# Patient Record
Sex: Female | Born: 1970 | Race: White | Hispanic: No | Smoking: Never smoker
Health system: Southern US, Community
[De-identification: ages and names within clinical notes are randomized; demographics above are authoritative.]

## PROBLEM LIST (undated history)

## (undated) DIAGNOSIS — F909 Attention-deficit hyperactivity disorder, unspecified type: Secondary | ICD-10-CM

## (undated) DIAGNOSIS — F32A Depression, unspecified: Secondary | ICD-10-CM

## (undated) DIAGNOSIS — L4050 Arthropathic psoriasis, unspecified: Secondary | ICD-10-CM

## (undated) DIAGNOSIS — F329 Major depressive disorder, single episode, unspecified: Secondary | ICD-10-CM

## (undated) DIAGNOSIS — J302 Other seasonal allergic rhinitis: Secondary | ICD-10-CM

## (undated) DIAGNOSIS — E079 Disorder of thyroid, unspecified: Secondary | ICD-10-CM

## (undated) DIAGNOSIS — I10 Essential (primary) hypertension: Secondary | ICD-10-CM

## (undated) HISTORY — PX: GANGLION CYST EXCISION: SHX1691

## (undated) HISTORY — PX: OVARIAN CYST REMOVAL: SHX89

## (undated) HISTORY — PX: HERNIA REPAIR: SHX51

## (undated) HISTORY — PX: CARDIAC SURGERY: SHX584

## (undated) HISTORY — PX: APPENDECTOMY: SHX54

## (undated) HISTORY — PX: SHOULDER SURGERY: SHX246

## (undated) HISTORY — PX: DILATION AND CURETTAGE OF UTERUS: SHX78

## (undated) HISTORY — PX: TONSILLECTOMY: SUR1361

---

## 2005-11-25 ENCOUNTER — Encounter: Payer: Self-pay | Admitting: Family Medicine

## 2006-03-10 ENCOUNTER — Ambulatory Visit: Payer: Self-pay | Admitting: Family Medicine

## 2006-04-02 ENCOUNTER — Ambulatory Visit: Payer: Self-pay | Admitting: Family Medicine

## 2006-04-08 ENCOUNTER — Ambulatory Visit: Payer: Self-pay | Admitting: Family Medicine

## 2006-04-28 ENCOUNTER — Ambulatory Visit: Payer: Self-pay | Admitting: Family Medicine

## 2006-05-04 ENCOUNTER — Ambulatory Visit: Payer: Self-pay | Admitting: Family Medicine

## 2006-05-05 DIAGNOSIS — G43909 Migraine, unspecified, not intractable, without status migrainosus: Secondary | ICD-10-CM | POA: Insufficient documentation

## 2006-05-05 DIAGNOSIS — F339 Major depressive disorder, recurrent, unspecified: Secondary | ICD-10-CM | POA: Insufficient documentation

## 2006-05-26 ENCOUNTER — Encounter: Payer: Self-pay | Admitting: Family Medicine

## 2006-05-28 ENCOUNTER — Encounter: Payer: Self-pay | Admitting: Family Medicine

## 2006-05-28 DIAGNOSIS — J301 Allergic rhinitis due to pollen: Secondary | ICD-10-CM | POA: Insufficient documentation

## 2006-05-29 ENCOUNTER — Ambulatory Visit: Payer: Self-pay | Admitting: Family Medicine

## 2006-07-03 ENCOUNTER — Telehealth: Payer: Self-pay | Admitting: Family Medicine

## 2006-08-05 ENCOUNTER — Ambulatory Visit: Payer: Self-pay | Admitting: Family Medicine

## 2006-08-05 DIAGNOSIS — R059 Cough, unspecified: Secondary | ICD-10-CM | POA: Insufficient documentation

## 2006-08-05 DIAGNOSIS — R05 Cough: Secondary | ICD-10-CM

## 2006-09-29 ENCOUNTER — Encounter: Payer: Self-pay | Admitting: Family Medicine

## 2006-10-21 ENCOUNTER — Ambulatory Visit: Payer: Self-pay | Admitting: Family Medicine

## 2006-10-21 DIAGNOSIS — F909 Attention-deficit hyperactivity disorder, unspecified type: Secondary | ICD-10-CM | POA: Insufficient documentation

## 2006-10-21 DIAGNOSIS — M26629 Arthralgia of temporomandibular joint, unspecified side: Secondary | ICD-10-CM | POA: Insufficient documentation

## 2006-11-04 ENCOUNTER — Encounter: Payer: Self-pay | Admitting: Family Medicine

## 2009-05-10 ENCOUNTER — Ambulatory Visit (HOSPITAL_COMMUNITY): Admission: RE | Admit: 2009-05-10 | Discharge: 2009-05-10 | Payer: Self-pay | Admitting: Obstetrics and Gynecology

## 2009-05-10 ENCOUNTER — Encounter: Payer: Self-pay | Admitting: Obstetrics and Gynecology

## 2010-03-30 ENCOUNTER — Emergency Department (HOSPITAL_BASED_OUTPATIENT_CLINIC_OR_DEPARTMENT_OTHER): Admission: EM | Admit: 2010-03-30 | Discharge: 2010-03-30 | Payer: Self-pay | Admitting: Emergency Medicine

## 2010-10-31 LAB — CBC
HCT: 39.3 % (ref 36.0–46.0)
Hemoglobin: 13.2 g/dL (ref 12.0–15.0)
MCV: 90.5 fL (ref 78.0–100.0)
Platelets: 290 10*3/uL (ref 150–400)
WBC: 9.2 10*3/uL (ref 4.0–10.5)

## 2010-10-31 LAB — RH IMMUNE GLOBULIN WORKUP (NOT WOMEN'S HOSP)

## 2011-03-28 DIAGNOSIS — F909 Attention-deficit hyperactivity disorder, unspecified type: Secondary | ICD-10-CM | POA: Insufficient documentation

## 2011-09-24 ENCOUNTER — Encounter (HOSPITAL_COMMUNITY): Payer: Self-pay | Admitting: *Deleted

## 2011-09-24 ENCOUNTER — Emergency Department (HOSPITAL_COMMUNITY)
Admission: EM | Admit: 2011-09-24 | Discharge: 2011-09-24 | Disposition: A | Discharge status: Home / Self Care | Payer: Worker's Compensation | Attending: Emergency Medicine | Admitting: Emergency Medicine

## 2011-09-24 DIAGNOSIS — W461XXA Contact with contaminated hypodermic needle, initial encounter: Secondary | ICD-10-CM

## 2011-09-24 DIAGNOSIS — G43909 Migraine, unspecified, not intractable, without status migrainosus: Secondary | ICD-10-CM | POA: Insufficient documentation

## 2011-09-24 DIAGNOSIS — F909 Attention-deficit hyperactivity disorder, unspecified type: Secondary | ICD-10-CM | POA: Insufficient documentation

## 2011-09-24 DIAGNOSIS — J45909 Unspecified asthma, uncomplicated: Secondary | ICD-10-CM | POA: Insufficient documentation

## 2011-09-24 DIAGNOSIS — E079 Disorder of thyroid, unspecified: Secondary | ICD-10-CM | POA: Insufficient documentation

## 2011-09-24 DIAGNOSIS — W460XXA Contact with hypodermic needle, initial encounter: Secondary | ICD-10-CM | POA: Insufficient documentation

## 2011-09-24 DIAGNOSIS — Z9889 Other specified postprocedural states: Secondary | ICD-10-CM | POA: Insufficient documentation

## 2011-09-24 DIAGNOSIS — Z7721 Contact with and (suspected) exposure to potentially hazardous body fluids: Secondary | ICD-10-CM | POA: Insufficient documentation

## 2011-09-24 HISTORY — DX: Disorder of thyroid, unspecified: E07.9

## 2011-09-24 HISTORY — DX: Attention-deficit hyperactivity disorder, unspecified type: F90.9

## 2011-09-24 LAB — HEPATIC FUNCTION PANEL
Albumin: 4 g/dL (ref 3.5–5.2)
Total Protein: 7.2 g/dL (ref 6.0–8.3)

## 2011-09-24 LAB — CREATININE, SERUM
Creatinine, Ser: 0.72 mg/dL (ref 0.50–1.10)
GFR calc non Af Amer: >90 mL/min (ref >90)

## 2011-09-24 MED ORDER — LOPINAVIR-RITONAVIR 200-50 MG PO TABS: 2.0000 | ORAL_TABLET | Freq: Two times a day (BID) | ORAL | Status: AC

## 2011-09-24 MED ORDER — LOPINAVIR-RITONAVIR 200-50 MG PO TABS
2.0000 | ORAL_TABLET | Freq: Once | ORAL | Status: AC
  Administered 2011-09-24: 2 via ORAL
  Filled 2011-09-24: qty 2

## 2011-09-24 MED ORDER — LAMIVUDINE-ZIDOVUDINE 150-300 MG PO TABS
1.0000 | ORAL_TABLET | Freq: Two times a day (BID) | ORAL | Status: DC
  Administered 2011-09-24: 1 via ORAL
  Filled 2011-09-24 (×3): qty 1

## 2011-09-24 MED ORDER — LAMIVUDINE-ZIDOVUDINE 150-300 MG PO TABS: 1.0000 | ORAL_TABLET | Freq: Two times a day (BID) | ORAL | Status: AC

## 2011-09-24 NOTE — ED Notes (Signed)
Patient does not need anything at this time. 

## 2011-09-24 NOTE — ED Notes (Addendum)
Pt states that she was stuck by a needle in her right thigh at [facility] at approximately 1130. Pt states that one of her clients brought in their meds in a trash bag and a needle was in it.  Pt is [redacted] weeks pregnant.

## 2011-09-28 NOTE — ED Provider Notes (Signed)
History     CSN: [id number]  Arrival date & time 09/24/11  1156   First MD Initiated Contact with Patient 09/24/11 1248      Chief Complaint  Patient presents with  . 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  She was attempting to document a new patients medicines, which were in a garbage bag.  She sustained a puncture by an uncapped lancet to her right anterior thigh when she placed the bag on her lap.  She does not know the patients full medical history except for diabetes and schizophrenia. She washed the wound with alcohol swabs after the incident and was sent here by her employer for prophylaxis and baseline drug screening.  She is immunized for Hepatitis B.  She is currently [redacted] weeks pregnant.  The history is provided by the patient.    Past Medical History  Diagnosis Date  . Asthma   . ADHD (attention deficit hyperactivity disorder)   . Thyroid disease   . Migraine     Past Surgical History  Procedure Date  . Shoulder surgery     right   . Appendectomy   . Tonsillectomy   . Hernia repair   . Ganglion cyst excision     right wrist  . Ovarian cyst removal   . Dilation and curettage of uterus     History reviewed. No pertinent family history.  History  Substance Use Topics  . Smoking status: Never Smoker   . Smokeless tobacco: Not on file  . Alcohol Use: No    OB History    Grav Para Term Preterm Abortions TAB SAB Ect Mult Living   1               Review of Systems  Constitutional: Negative for fever.  HENT: Negative for congestion, sore throat and neck pain.   Eyes: Negative.   Respiratory: Negative for chest tightness and shortness of breath.   Cardiovascular: Negative for chest pain.  Gastrointestinal: Negative for nausea and abdominal pain.  Genitourinary: Negative.   Musculoskeletal: Negative for joint  swelling and arthralgias.  Skin: Negative.  Negative for rash and wound.  Neurological: Negative for dizziness, weakness, light-headedness, numbness and headaches.  Hematological: Negative.   Psychiatric/Behavioral: Negative.     Allergies  Bactrim; Biaxin; Hydromorphone hcl; Nalbuphine; and Penicillins  Home Medications   Current Outpatient Rx  Name Route Sig Dispense Refill  . MELATONIN 10 MG PO TABS Oral Take 1 tablet by mouth at bedtime.    Marland Kitchen PRENATAL MULTI +DHA 27-0.8-228 MG PO CAPS Oral Take 1 capsule by mouth daily.    Marland Kitchen LAMIVUDINE-ZIDOVUDINE 150-300 MG PO TABS Oral Take 1 tablet by mouth 2 (two) times daily. 56 tablet 0  . LOPINAVIR-RITONAVIR 200-50 MG PO TABS Oral Take 2 tablets by mouth 2 (two) times daily. 112 tablet 0    BP 107/81  Pulse 84  Temp(Src) 98.9 F (37.2 C) (Oral)  Resp 18  Ht 5\' 6"  (1.676 m)  Wt 171 lb (77.565 kg)  BMI 27.60 kg/m2  SpO2 100%  Physical Exam  Nursing note and vitals reviewed. Constitutional: She is oriented to person, place, and time. She appears well-developed and well-nourished.  HENT:  Head: Normocephalic and atraumatic.  Eyes: Conjunctivae are normal.  Neck: Normal range of motion.  Cardiovascular: Normal rate.   Pulmonary/Chest: Effort normal.  Abdominal: Soft.  Musculoskeletal: Normal range of motion.  Neurological:  She is alert and oriented to person, place, and time.  Skin: Skin is warm and dry.       Small pen circle on patients right mid anterior thigh.  Barely perceptible skin puncture site.  Psychiatric: She has a normal mood and affect.    ED Course  Procedures (including critical care time)   Labs Reviewed  HIV RAPID SCREEN (BLD OR BODY FLD EXPOSURE)  HEPATITIS B SURFACE ANTIGEN  HEPATITIS C ANTIBODY (REFLEX)  CREATININE, SERUM  HEPATIC FUNCTION PANEL  LAB REPORT - SCANNED   No results found.   1. Exposure to body fluids by contaminated hypodermic needle stick       MDM  Discussed risks and  benefits of prophylactic tx for potential for HIV exposure.  Patient agreeable to start with combivir and kaletra 28 day tx.  Also discussed need to have employer contact source pt for blood testing as well.  Recommended f/u with pcp or employers preferred occupational med doctor for further tx/ 3 month and 6 month exposure panels.  Candis Musa, PA 09/28/11 2117

## 2011-09-29 NOTE — ED Provider Notes (Signed)
Medical screening examination/treatment/procedure(s) were performed by non-physician practitioner and as supervising physician I was immediately available for consultation/collaboration.  Nicoletta Dress. Colon Branch, MD 09/29/11 [phone]

## 2014-05-29 ENCOUNTER — Encounter (HOSPITAL_COMMUNITY): Payer: Self-pay | Admitting: *Deleted

## 2014-07-24 ENCOUNTER — Emergency Department
Admission: EM | Admit: 2014-07-24 | Discharge: 2014-07-24 | Disposition: A | Discharge status: Home / Self Care | Payer: BC Managed Care – PPO | Source: Home / Self Care | Attending: Emergency Medicine | Admitting: Emergency Medicine

## 2014-07-24 ENCOUNTER — Encounter: Payer: Self-pay | Admitting: *Deleted

## 2014-07-24 DIAGNOSIS — G43801 Other migraine, not intractable, with status migrainosus: Secondary | ICD-10-CM

## 2014-07-24 DIAGNOSIS — J0100 Acute maxillary sinusitis, unspecified: Secondary | ICD-10-CM

## 2014-07-24 DIAGNOSIS — J4521 Mild intermittent asthma with (acute) exacerbation: Secondary | ICD-10-CM

## 2014-07-24 HISTORY — DX: Other seasonal allergic rhinitis: J30.2

## 2014-07-24 MED ORDER — KETOROLAC TROMETHAMINE 60 MG/2ML IM SOLN
60.0000 mg | Freq: Once | INTRAMUSCULAR | Status: AC
  Administered 2014-07-24: 60 mg via INTRAMUSCULAR

## 2014-07-24 MED ORDER — PREDNISONE 20 MG PO TABS: 20.0000 mg | ORAL_TABLET | Freq: Two times a day (BID) | ORAL | Status: DC

## 2014-07-24 MED ORDER — AZITHROMYCIN 250 MG PO TABS: ORAL_TABLET | ORAL | Status: DC

## 2014-07-24 MED ORDER — METHYLPREDNISOLONE ACETATE 80 MG/ML IJ SUSP
80.0000 mg | Freq: Once | INTRAMUSCULAR | Status: AC
  Administered 2014-07-24: 80 mg via INTRAMUSCULAR

## 2014-07-24 MED ORDER — ONDANSETRON HCL 4 MG PO TABS: 4.0000 mg | ORAL_TABLET | Freq: Three times a day (TID) | ORAL | Status: DC | PRN

## 2014-07-24 NOTE — ED Notes (Signed)
Pt c/o bilateral ear pain, popping and fluid feeling x 4 days, with HA,sinus pressure and some vertigo. Denies fever.

## 2014-07-24 NOTE — ED Provider Notes (Signed)
CSN: [id number]     Arrival date & time 07/24/14  1758 History   First MD Initiated Contact with Patient 07/24/14 1836     Chief Complaint  Patient presents with  . Otalgia  . Headache   migraine, upper respiratory symptoms  Complex history: Patient is a 43 y.o. female presenting with migraines. The history is provided by the patient.  Migraine This is a recurrent problem. The current episode started 2 days ago. The problem occurs constantly. The problem has been gradually worsening. Associated symptoms include headaches. Pertinent negatives include no chest pain, no abdominal pain and no shortness of breath. Associated symptoms comments: Mild vertigo without any other focal neurologic symptoms. Had mild nausea which resolved. Exacerbated by: Exposure to light. The symptoms are relieved by rest. She has tried nothing for the symptoms.   used Imitrex in the past but has since found to be allergic or intolerant to it. Her migraine headache is bi-temporal bi-frontal and parietal bilaterally. Dull. Intensity 5 out of 10. This is not the worst migraine she is ever had. She states that Toradol has worked well in the past. She will be seeing her neurologist within the next week for reevaluation.  URI HISTORY  Helmut Musterlicia is a 43 y.o. female who complains of onset of upper respiratory/sinus symptoms for 4 days. Progressively worsening. And she feels this triggered a migraine the past 2 days which is progressively worsening. She also feels it's just started to trigger her asthma which had been controlled.  Have been using over-the-counter treatment which did not significantly help.  Mild chills/sweats +  Fever  +  Nasal congestion +  Discolored, green nasal drainage Positive sinus pain/pressure No sore throat  +  Cough, nonproductive Mild wheezing Positive chest congestion No hemoptysis No shortness of breath No pleuritic pain  No itchy/red eyes Mild bilateral earache  Had mild nausea,  which has since resolved No vomiting No abdominal pain No diarrhea  No skin rashes +  Fatigue No myalgias No headache   Past Medical History  Diagnosis Date  . Asthma   . ADHD (attention deficit hyperactivity disorder)   . Thyroid disease   . Migraine   . Seasonal allergies    Past Surgical History  Procedure Laterality Date  . Shoulder surgery      right   . Appendectomy    . Tonsillectomy    . Hernia repair    . Ganglion cyst excision      right wrist  . Ovarian cyst removal    . Dilation and curettage of uterus     Family History  Problem Relation Age of Onset  . Migraines Mother   . GER disease Mother   . Narcolepsy Mother   . Restless legs syndrome Mother    History  Substance Use Topics  . Smoking status: Never Smoker   . Smokeless tobacco: Not on file  . Alcohol Use: No   OB History    Gravida Para Term Preterm AB TAB SAB Ectopic Multiple Living   1              Review of Systems  Respiratory: Negative for shortness of breath.   Cardiovascular: Negative for chest pain.  Gastrointestinal: Negative for abdominal pain.  Neurological: Positive for headaches.  All other systems reviewed and are negative.  Note: She has a 164-year-old. She is breast-feeding, only sometimes at night. We reviewed that she should pump her breasts and not breast-feed while taking any treatment today  or taking any prescribed medications. She voiced understanding  Allergies  Bactrim; Biaxin; Clarithromycin; Dilaudid; Hydromorphone hcl; Imitrex; Nalbuphine; and Penicillins Has taken Zithromax in the past without problems Home Medications   Prior to Admission medications   Medication Sig Start Date End Date Taking? Authorizing Provider  loratadine (CLARITIN) 10 MG tablet Take 10 mg by mouth daily.   Yes Historical Provider, MD  montelukast (SINGULAIR) 10 MG tablet Take 10 mg by mouth at bedtime.   Yes Historical Provider, MD  norethindrone-ethinyl estradiol (JUNEL FE,GILDESS  FE,LOESTRIN FE) 1-20 MG-MCG tablet Take 1 tablet by mouth daily.   Yes Historical Provider, MD  azithromycin (ZITHROMAX Z-PAK) 250 MG tablet Take 2 tablets on day one, then 1 tablet daily on days 2 through 5 07/24/14   Lajean Manes, MD  Melatonin 10 MG TABS Take 1 tablet by mouth at bedtime.    Historical Provider, MD  predniSONE (DELTASONE) 20 MG tablet Take 1 tablet (20 mg total) by mouth 2 (two) times daily with a meal. X 7 days 07/24/14   Lajean Manes, MD  Prenatal Vit-Fe Fum-FA-Omega (PRENATAL MULTI +DHA) 27-0.8-228 MG CAPS Take 1 capsule by mouth daily.    Historical Provider, MD   BP 122/71 mmHg  Pulse 82  Temp(Src) 98.3 F (36.8 C) (Oral)  Resp 16  Ht 5\' 6"  (1.676 m)  Wt 177 lb (80.287 kg)  BMI 28.58 kg/m2  SpO2 100%  Breastfeeding? Unknown Physical Exam  Constitutional: She is oriented to person, place, and time. She appears well-developed and well-nourished. She appears distressed (Uncomfortable from headache.).  HENT:  Head: Normocephalic and atraumatic. Head is without raccoon's eyes, without contusion, without right periorbital erythema and without left periorbital erythema.  Right Ear: Tympanic membrane, external ear and ear canal normal. No drainage. Tympanic membrane is not erythematous.  Left Ear: Tympanic membrane, external ear and ear canal normal. No drainage. Tympanic membrane is not erythematous.  Nose: Mucosal edema and rhinorrhea present. Right sinus exhibits maxillary sinus tenderness and frontal sinus tenderness. Left sinus exhibits maxillary sinus tenderness and frontal sinus tenderness.  Mouth/Throat: Oropharynx is clear and moist and mucous membranes are normal. No oral lesions. No dental abscesses. No oropharyngeal exudate, posterior oropharyngeal edema or posterior oropharyngeal erythema.  Nose: Boggy turbinates with serous mucoid drainage bilaterally.   No cranial bruits.  Normal temporal arteries.  Eyes: Conjunctivae, EOM and lids are normal. Pupils are  equal, round, and reactive to light. Right eye exhibits no discharge. Left eye exhibits no discharge. No scleral icterus.  Fundoscopic exam:      The right eye shows no exudate, no hemorrhage and no papilledema.       The left eye shows no exudate, no hemorrhage and no papilledema.  Neck: Neck supple. Normal carotid pulses and no JVD present. Carotid bruit is not present. No tracheal deviation present. No thyromegaly present.  Cardiovascular: Normal rate, regular rhythm and normal heart sounds.   Pulmonary/Chest: Effort normal and breath sounds normal. No respiratory distress. She has no decreased breath sounds. She has no wheezes. She has no rhonchi. She has no rales.  Breath sounds normal, except minimal late expiratory wheeze on forced expiration only.  Abdominal: Soft. There is no tenderness.  Musculoskeletal: She exhibits no edema or tenderness.  Lymphadenopathy:    She has no cervical adenopathy.  Neurological: She is alert and oriented to person, place, and time. She has normal strength and normal reflexes. She displays no tremor. No cranial nerve deficit or sensory deficit. Coordination and  gait normal.  Reflex Scores:      Tricep reflexes are 2+ on the right side and 2+ on the left side.      Bicep reflexes are 2+ on the right side and 2+ on the left side.      Brachioradialis reflexes are 2+ on the right side and 2+ on the left side.      Patellar reflexes are 2+ on the right side and 2+ on the left side.      Achilles reflexes are 2+ on the right side and 2+ on the left side. Mildly positive Romberg. No focal neurologic deficit  Skin: Skin is warm and dry. No rash noted.  Psychiatric: She has a normal mood and affect.  Nursing note and vitals reviewed.   ED Course  Procedures (including critical care time)   MDM   1. Other migraine with status migrainosus, not intractable   2. Acute maxillary sinusitis, recurrence not specified   3. Asthma with acute exacerbation, mild  intermittent    No evidence of any acute neurologic or intracranial event. Over 45 minutes spent, greater than 50% of the time spent for counseling and coordination of care. Treatment options discussed, as well as risks, benefits, alternatives. Patient voiced understanding and agreement with the following plans: Depo-Medrol 80 mg IM stat and Toradol 60 mg IM stat. She was observed for over 20 minutes and did well. Her headache significantly improved. Discharge Medication List as of 07/24/2014  6:55 PM    START taking these medications   Details  azithromycin (ZITHROMAX Z-PAK) 250 MG tablet Take 2 tablets on day one, then 1 tablet daily on days 2 through 5, Normal    predniSONE (DELTASONE) 20 MG tablet Take 1 tablet (20 mg total) by mouth 2 (two) times daily with a meal. X 7 days, Starting 07/24/2014, Until Discontinued, Normal       Zofran 4 mg by mouth every 8 hours when necessary nausea. Other symptomatic care discussed. Other advice given. Follow-up with your  neurologist and primary care doctor in 5-7 days , or sooner if symptoms become worse. Precautions discussed. Red flags discussed.--Emergency room if any red flag  Questions invited and answered. Patient voiced understanding and agreement.    Lajean Manesavid Massey, MD 07/24/14 [phone]

## 2014-10-21 ENCOUNTER — Emergency Department
Admission: EM | Admit: 2014-10-21 | Discharge: 2014-10-21 | Disposition: A | Discharge status: Home / Self Care | Payer: BLUE CROSS/BLUE SHIELD | Source: Home / Self Care | Attending: Family Medicine | Admitting: Family Medicine

## 2014-10-21 ENCOUNTER — Encounter: Payer: Self-pay | Admitting: *Deleted

## 2014-10-21 DIAGNOSIS — J069 Acute upper respiratory infection, unspecified: Secondary | ICD-10-CM | POA: Diagnosis not present

## 2014-10-21 DIAGNOSIS — B9789 Other viral agents as the cause of diseases classified elsewhere: Principal | ICD-10-CM

## 2014-10-21 DIAGNOSIS — J9801 Acute bronchospasm: Secondary | ICD-10-CM

## 2014-10-21 MED ORDER — ALBUTEROL SULFATE HFA 108 (90 BASE) MCG/ACT IN AERS: 2.0000 | INHALATION_SPRAY | RESPIRATORY_TRACT | Status: AC | PRN

## 2014-10-21 MED ORDER — AZITHROMYCIN 250 MG PO TABS: ORAL_TABLET | ORAL | Status: DC

## 2014-10-21 MED ORDER — PREDNISONE 20 MG PO TABS: 20.0000 mg | ORAL_TABLET | Freq: Two times a day (BID) | ORAL | Status: DC

## 2014-10-21 NOTE — ED Provider Notes (Signed)
CSN: [id number]     Arrival date & time 10/21/14  1509 History   First MD Initiated Contact with Patient 10/21/14 1628     Chief Complaint  Patient presents with  . Cough      HPI Comments: Patient complains of two day history of typical cold-like symptoms including mild sore throat, sinus congestion, headache, chills/sweats, and cough.  She has developed some loose stools but no nausea/vomiting.  She has developed shortness of breath with activity but no wheezing.  She has a history of mild asthma.  The history is provided by the patient.    Past Medical History  Diagnosis Date  . Asthma   . ADHD (attention deficit hyperactivity disorder)   . Thyroid disease   . Migraine   . Seasonal allergies    Past Surgical History  Procedure Laterality Date  . Shoulder surgery      right   . Appendectomy    . Tonsillectomy    . Hernia repair    . Ganglion cyst excision      right wrist  . Ovarian cyst removal    . Dilation and curettage of uterus     Family History  Problem Relation Age of Onset  . Migraines Mother   . GER disease Mother   . Narcolepsy Mother   . Restless legs syndrome Mother    History  Substance Use Topics  . Smoking status: Never Smoker   . Smokeless tobacco: Not on file  . Alcohol Use: No   OB History    Gravida Para Term Preterm AB TAB SAB Ectopic Multiple Living   1              Review of Systems + sore throat + sneezing + cough No pleuritic pain No wheezing + nasal congestion + post-nasal drainage No sinus pain/pressure No itchy/red eyes ? earache No hemoptysis + SOB with activity No fever, + chills No nausea No vomiting No abdominal pain + diarrhea No urinary symptoms No skin rash + fatigue No myalgias + headache Used OTC meds without relief  Allergies  Bactrim; Biaxin; Clarithromycin; Dilaudid; Hydromorphone hcl; Imitrex; Nalbuphine; and Penicillins  Home Medications   Prior to Admission medications   Medication Sig Start  Date End Date Taking? Authorizing Provider  albuterol (PROVENTIL HFA;VENTOLIN HFA) 108 (90 BASE) MCG/ACT inhaler Inhale 2 puffs into the lungs every 4 (four) hours as needed for wheezing or shortness of breath. 10/21/14   Lattie Haw, MD  azithromycin (ZITHROMAX Z-PAK) 250 MG tablet Take 2 tablets on day one, then 1 tablet daily on days 2 through 5 (Rx void after 10/29/14) 10/21/14   Lattie Haw, MD  loratadine (CLARITIN) 10 MG tablet Take 10 mg by mouth daily.    Historical Provider, MD  Melatonin 10 MG TABS Take 1 tablet by mouth at bedtime.    Historical Provider, MD  montelukast (SINGULAIR) 10 MG tablet Take 10 mg by mouth at bedtime.    Historical Provider, MD  norethindrone-ethinyl estradiol (JUNEL FE,GILDESS FE,LOESTRIN FE) 1-20 MG-MCG tablet Take 1 tablet by mouth daily.    Historical Provider, MD  ondansetron (ZOFRAN) 4 MG tablet Take 1 tablet (4 mg total) by mouth every 8 (eight) hours as needed for nausea or vomiting. 07/24/14   Lajean Manes, MD  predniSONE (DELTASONE) 20 MG tablet Take 1 tablet (20 mg total) by mouth 2 (two) times daily with a meal. X 5 days 10/21/14   Lattie Haw, MD  Prenatal  Vit-Fe Fum-FA-Omega (PRENATAL MULTI +DHA) 27-0.8-228 MG CAPS Take 1 capsule by mouth daily.    Historical Provider, MD   BP 133/85 mmHg  Pulse 89  Temp(Src) 98.1 F (36.7 C) (Oral)  Ht 5\' 6"  (1.676 m)  Wt 179 lb (81.194 kg)  BMI 28.91 kg/m2  SpO2 99% Physical Exam Nursing notes and Vital Signs reviewed. Appearance:  Patient appears stated age, and in no acute distress Eyes:  Pupils are equal, round, and reactive to light and accomodation.  Extraocular movement is intact.  Conjunctivae are not inflamed  Ears:  Canals normal.  Tympanic membranes normal.  Nose:  Mildly congested turbinates.  No sinus tenderness.  Pharynx:  Normal Neck:  Supple.  Slightly tender shotty posterior nodes are palpated bilaterally  Lungs:   Faint expiratory wheezes heard anteriorly.  Breath sounds are  equal.  Heart:  Regular rate and rhythm without murmurs, rubs, or gallops.  Abdomen:  Nontender without masses or hepatosplenomegaly.  Bowel sounds are present.  No CVA or flank tenderness.  Extremities:  No edema.  No calf tenderness Skin:  No rash present.   ED Course  Procedures  none   MDM   1. Viral URI with cough   2. Bronchospasm    There is no evidence of bacterial infection today.  Treat symptomatically for now  Prednisone burst.  Prescription written for Benzonatate (Tessalon) to take at bedtime for night-time cough.  Refill albuterol MDI Take plain guaifenesin (1200mg  extended release tabs such as Mucinex) twice daily, with plenty of water, for cough and congestion. Get adequate rest.   May use Afrin nasal spray (or generic oxymetazoline) twice daily for about 5 days.  Also recommend using saline nasal spray several times daily and saline nasal irrigation (AYR is a common brand).   Try warm salt water gargles for sore throat.  Stop all antihistamines for now, and other non-prescription cough/cold preparations. May continue albuterol by nebulizer as needed. Begin Azithromycin if not improving about one week or if persistent fever develops (Given a prescription to hold, with an expiration date.  Although she claims adverse effects from Biaxin, she has taken azithromycin without adverse effects)  Follow-up with family doctor if not improving about10 days.     Lattie HawStephen A Hakim Minniefield, MD 10/28/14 1005

## 2014-10-21 NOTE — Discharge Instructions (Signed)
Take plain guaifenesin (1200mg  extended release tabs such as Mucinex) twice daily, with plenty of water, for cough and congestion. Get adequate rest.   May use Afrin nasal spray (or generic oxymetazoline) twice daily for about 5 days.  Also recommend using saline nasal spray several times daily and saline nasal irrigation (AYR is a common brand).   Try warm salt water gargles for sore throat.  Stop all antihistamines for now, and other non-prescription cough/cold preparations. May continue albuterol by nebulizer as needed. Begin Azithromycin if not improving about one week or if persistent fever develops  Follow-up with family doctor if not improving about10 days.

## 2014-10-21 NOTE — ED Notes (Signed)
Pt complains of cough and congestion for 2 days.  Cough is productive and green. Also some diarrhea.

## 2015-02-18 ENCOUNTER — Encounter: Payer: Self-pay | Admitting: Emergency Medicine

## 2015-02-18 ENCOUNTER — Emergency Department
Admission: EM | Admit: 2015-02-18 | Discharge: 2015-02-18 | Disposition: A | Discharge status: Home / Self Care | Payer: BLUE CROSS/BLUE SHIELD | Source: Home / Self Care | Attending: Family Medicine | Admitting: Family Medicine

## 2015-02-18 ENCOUNTER — Emergency Department (INDEPENDENT_AMBULATORY_CARE_PROVIDER_SITE_OTHER): Payer: BLUE CROSS/BLUE SHIELD

## 2015-02-18 DIAGNOSIS — M79671 Pain in right foot: Secondary | ICD-10-CM

## 2015-02-18 DIAGNOSIS — S93401A Sprain of unspecified ligament of right ankle, initial encounter: Secondary | ICD-10-CM | POA: Diagnosis not present

## 2015-02-18 DIAGNOSIS — S93601A Unspecified sprain of right foot, initial encounter: Secondary | ICD-10-CM | POA: Diagnosis not present

## 2015-02-18 DIAGNOSIS — M25571 Pain in right ankle and joints of right foot: Secondary | ICD-10-CM | POA: Diagnosis not present

## 2015-02-18 HISTORY — DX: Arthropathic psoriasis, unspecified: L40.50

## 2015-02-18 NOTE — ED Provider Notes (Signed)
CSN: [id number]     Arrival date & time 02/18/15  1140 History   First MD Initiated Contact with Patient 02/18/15 1218     Chief Complaint  Patient presents with  . Ankle Pain  . Foot Pain      HPI Comments: Yesterday while playing with her dog, patient inverted her right foot/ankle and felt two popping sensations followed by pain/swelling on her dorsal/lateral foot.  Patient is a 44 y.o. female presenting with ankle pain. The history is provided by the patient.  Ankle Pain Location:  Ankle Time since incident:  1 day Injury: yes   Mechanism of injury comment:  Inverted ankle Ankle location:  R ankle Pain details:    Quality:  Aching   Radiates to:  Does not radiate   Severity:  Mild   Onset quality:  Sudden   Duration:  1 day   Timing:  Constant   Progression:  Unchanged Chronicity:  New Dislocation: no   Prior injury to area:  No Relieved by:  Nothing Worsened by:  Bearing weight, extension and flexion Ineffective treatments:  NSAIDs and ice Associated symptoms: stiffness and swelling   Associated symptoms: no back pain, no decreased ROM, no muscle weakness, no numbness and no tingling     Past Medical History  Diagnosis Date  . Asthma   . ADHD (attention deficit hyperactivity disorder)   . Thyroid disease   . Migraine   . Seasonal allergies   . Psoriatic arthritis    Past Surgical History  Procedure Laterality Date  . Shoulder surgery      right   . Appendectomy    . Tonsillectomy    . Hernia repair    . Ganglion cyst excision      right wrist  . Ovarian cyst removal    . Dilation and curettage of uterus     Family History  Problem Relation Age of Onset  . Migraines Mother   . GER disease Mother   . Narcolepsy Mother   . Restless legs syndrome Mother    History  Substance Use Topics  . Smoking status: Never Smoker   . Smokeless tobacco: Not on file  . Alcohol Use: No   OB History    Gravida Para Term Preterm AB TAB SAB Ectopic Multiple Living     1              Review of Systems  Musculoskeletal: Positive for stiffness. Negative for back pain.  All other systems reviewed and are negative.   Allergies  Bactrim; Biaxin; Clarithromycin; Dilaudid; Hydromorphone hcl; Imitrex; Nalbuphine; and Penicillins  Home Medications   Prior to Admission medications   Medication Sig Start Date End Date Taking? Authorizing Provider  DULoxetine (CYMBALTA) 20 MG capsule Take 20 mg by mouth daily.   Yes Historical Provider, MD  methocarbamol (ROBAXIN) 750 MG tablet Take 750 mg by mouth 3 (three) times daily.   Yes Historical Provider, MD  albuterol (PROVENTIL HFA;VENTOLIN HFA) 108 (90 BASE) MCG/ACT inhaler Inhale 2 puffs into the lungs every 4 (four) hours as needed for wheezing or shortness of breath. 10/21/14   Lattie Haw, MD  azithromycin (ZITHROMAX Z-PAK) 250 MG tablet Take 2 tablets on day one, then 1 tablet daily on days 2 through 5 (Rx void after 10/29/14) 10/21/14   Lattie Haw, MD  loratadine (CLARITIN) 10 MG tablet Take 10 mg by mouth daily.    Historical Provider, MD  Melatonin 10 MG TABS Take 1  tablet by mouth at bedtime.    Historical Provider, MD  montelukast (SINGULAIR) 10 MG tablet Take 10 mg by mouth at bedtime.    Historical Provider, MD  norethindrone-ethinyl estradiol (JUNEL FE,GILDESS FE,LOESTRIN FE) 1-20 MG-MCG tablet Take 1 tablet by mouth daily.    Historical Provider, MD  ondansetron (ZOFRAN) 4 MG tablet Take 1 tablet (4 mg total) by mouth every 8 (eight) hours as needed for nausea or vomiting. 07/24/14   Lajean Manes, MD  predniSONE (DELTASONE) 20 MG tablet Take 1 tablet (20 mg total) by mouth 2 (two) times daily with a meal. X 5 days 10/21/14   Lattie Haw, MD  Prenatal Vit-Fe Fum-FA-Omega (PRENATAL MULTI +DHA) 27-0.8-228 MG CAPS Take 1 capsule by mouth daily.    Historical Provider, MD   BP 120/83 mmHg  Pulse 96  Temp(Src) 98.4 F (36.9 C) (Oral)  Resp 1  Ht 5\' 5"  (1.651 m)  Wt 174 lb (78.926 kg)  BMI 28.96  kg/m2  SpO2 97%  LMP 01/22/2015 (Exact Date) Physical Exam  Constitutional: She is oriented to person, place, and time. She appears well-developed and well-nourished. No distress.  HENT:  Head: Atraumatic.  Eyes: Pupils are equal, round, and reactive to light.  Musculoskeletal:       Right ankle: She exhibits swelling. She exhibits normal range of motion, no ecchymosis, no deformity, no laceration and normal pulse. Tenderness. Lateral malleolus, AITFL and CF ligament tenderness found. No posterior TFL, no head of 5th metatarsal and no proximal fibula tenderness found. Achilles tendon normal.       Feet:  Neurological: She is alert and oriented to person, place, and time.  Skin: Skin is warm and dry.  Nursing note and vitals reviewed.   ED Course  Procedures  none  Imaging Review Dg Ankle Complete Right  02/18/2015   CLINICAL DATA:  Twisting injury right ankle today. The patient heard 2 pops.  EXAM: RIGHT ANKLE - COMPLETE 3+ VIEW  COMPARISON:  None.  FINDINGS: There is no evidence of fracture, dislocation, or joint effusion. There is no evidence of arthropathy or other focal bone abnormality. Soft tissues are unremarkable.  IMPRESSION: Negative exam.   Electronically Signed   By: Drusilla Kanner M.D.   On: 02/18/2015 12:28   Dg Foot Complete Right  02/18/2015   CLINICAL DATA:  Rolled right ankle at home while bending over to pick up a ball. Lateral ankle and proximal lateral dorsal foot pain as well as some pain at the medial aspect of the heel. Initial encounter.  EXAM: RIGHT FOOT COMPLETE - 3+ VIEW  COMPARISON:  None.  FINDINGS: There is no evidence of fracture or dislocation. There is no evidence of arthropathy or other focal bone abnormality. Soft tissues are unremarkable.  IMPRESSION: Negative.   Electronically Signed   By: Sebastian Ache   On: 02/18/2015 12:30     MDM   1. Right ankle sprain, initial encounter   2. Right foot sprain, initial encounter    Ace wrap applied.   AirCast stirrup splint fitted. Apply ice pack for 30 minutes every 1 to 2 hours today and tomorrow.  Elevate.  Use crutches for 3 to 5 days (patient has crutches at home).  Wear Ace wrap until swelling decreases.  Wear brace for about 2 to 3 weeks.  Begin range of motion and stretching exercises in about 5 days as per instruction sheet.  May take Ibuprofen 200mg , 4 tabs every 8 hours with food.  Followup  with Dr. Rodney Langton or Dr. Denyse Amass ([facility]) if not improving about two weeks.     Lattie Haw, MD 02/18/15 1246

## 2015-02-18 NOTE — ED Notes (Signed)
Reports standing up and rolling right foot/ankle yesterday; iced and elevated but more pain today plus edema.

## 2015-02-18 NOTE — Discharge Instructions (Signed)
Apply ice pack for 30 minutes every 1 to 2 hours today and tomorrow.  Elevate.  Use crutches for 3 to 5 days.  Wear Ace wrap until swelling decreases.  Wear brace for about 2 to 3 weeks.  Begin range of motion and stretching exercises in about 5 days as per instruction sheet.  May take Ibuprofen 200mg , 4 tabs every 8 hours with food.    Ankle Sprain An ankle sprain is an injury to the strong, fibrous tissues (ligaments) that hold the bones of your ankle joint together.  CAUSES An ankle sprain is usually caused by a fall or by twisting your ankle. Ankle sprains most commonly occur when you step on the outer edge of your foot, and your ankle turns inward. People who participate in sports are more prone to these types of injuries.  SYMPTOMS   Pain in your ankle. The pain may be present at rest or only when you are trying to stand or walk.  Swelling.  Bruising. Bruising may develop immediately or within 1 to 2 days after your injury.  Difficulty standing or walking, particularly when turning corners or changing directions. DIAGNOSIS  Your caregiver will ask you details about your injury and perform a physical exam of your ankle to determine if you have an ankle sprain. During the physical exam, your caregiver will press on and apply pressure to specific areas of your foot and ankle. Your caregiver will try to move your ankle in certain ways. An X-ray exam may be done to be sure a bone was not broken or a ligament did not separate from one of the bones in your ankle (avulsion fracture).  TREATMENT  Certain types of braces can help stabilize your ankle. Your caregiver can make a recommendation for this. Your caregiver may recommend the use of medicine for pain. If your sprain is severe, your caregiver may refer you to a surgeon who helps to restore function to parts of your skeletal system (orthopedist) or a physical therapist. HOME CARE INSTRUCTIONS   Apply ice to your injury for 1-2 days or as  directed by your caregiver. Applying ice helps to reduce inflammation and pain.  Put ice in a plastic bag.  Place a towel between your skin and the bag.  Leave the ice on for 15-20 minutes at a time, every 2 hours while you are awake.  Only take over-the-counter or prescription medicines for pain, discomfort, or fever as directed by your caregiver.  Elevate your injured ankle above the level of your heart as much as possible for 2-3 days.  If your caregiver recommends crutches, use them as instructed. Gradually put weight on the affected ankle. Continue to use crutches or a cane until you can walk without feeling pain in your ankle.  If you have a plaster splint, wear the splint as directed by your caregiver. Do not rest it on anything harder than a pillow for the first 24 hours. Do not put weight on it. Do not get it wet. You may take it off to take a shower or bath.  You may have been given an elastic bandage to wear around your ankle to provide support. If the elastic bandage is too tight (you have numbness or tingling in your foot or your foot becomes cold and blue), adjust the bandage to make it comfortable.  If you have an air splint, you may blow more air into it or let air out to make it more comfortable. You may take  your splint off at night and before taking a shower or bath. Wiggle your toes in the splint several times per day to decrease swelling. SEEK MEDICAL CARE IF:   You have rapidly increasing bruising or swelling.  Your toes feel extremely cold or you lose feeling in your foot.  Your pain is not relieved with medicine. SEEK IMMEDIATE MEDICAL CARE IF:  Your toes are numb or blue.  You have severe pain that is increasing. MAKE SURE YOU:   Understand these instructions.  Will watch your condition.  Will get help right away if you are not doing well or get worse. Document Released: 07/14/2005 Document Revised: 04/07/2012 Document Reviewed: 07/26/2011 [facility]  Patient Information 2015 [place], [place]. This information is not intended to replace advice given to you by your health care provider. Make sure you discuss any questions you have with your health care provider.   Foot Sprain The muscles and cord like structures which attach muscle to bone (tendons) that surround the feet are made up of units. A foot sprain can occur at the weakest spot in any of these units. This condition is most often caused by injury to or overuse of the foot, as from playing contact sports, or aggravating a previous injury, or from poor conditioning, or obesity. SYMPTOMS  Pain with movement of the foot.  Tenderness and swelling at the injury site.  Loss of strength is present in moderate or severe sprains. THE THREE GRADES OR SEVERITY OF FOOT SPRAIN ARE:  Mild (Grade I): Slightly pulled muscle without tearing of muscle or tendon fibers or loss of strength.  Moderate (Grade II): Tearing of fibers in a muscle, tendon, or at the attachment to bone, with small decrease in strength.  Severe (Grade III): Rupture of the muscle-tendon-bone attachment, with separation of fibers. Severe sprain requires surgical repair. Often repeating (chronic) sprains are caused by overuse. Sudden (acute) sprains are caused by direct injury or over-use. DIAGNOSIS  Diagnosis of this condition is usually by your own observation. If problems continue, a caregiver may be required for further evaluation and treatment. X-rays may be required to make sure there are not breaks in the bones (fractures) present. Continued problems may require physical therapy for treatment. PREVENTION  Use strength and conditioning exercises appropriate for your sport.  Warm up properly prior to working out.  Use athletic shoes that are made for the sport you are participating in.  Allow adequate time for healing. Early return to activities makes repeat injury more likely, and can lead to an unstable arthritic foot  that can result in prolonged disability. Mild sprains generally heal in 3 to 10 days, with moderate and severe sprains taking 2 to 10 weeks. Your caregiver can help you determine the proper time required for healing. HOME CARE INSTRUCTIONS   Apply ice to the injury for 15-20 minutes, 03-04 times per day. Put the ice in a plastic bag and place a towel between the bag of ice and your skin.  An elastic wrap (like an Ace bandage) may be used to keep swelling down.  Keep foot above the level of the heart, or at least raised on a footstool, when swelling and pain are present.  Try to avoid use other than gentle range of motion while the foot is painful. Do not resume use until instructed by your caregiver. Then begin use gradually, not increasing use to the point of pain. If pain does develop, decrease use and continue the above measures, gradually increasing activities  that do not cause discomfort, until you gradually achieve normal use.  Use crutches if and as instructed, and for the length of time instructed.  Keep injured foot and ankle wrapped between treatments.  Massage foot and ankle for comfort and to keep swelling down. Massage from the toes up towards the knee.  Only take over-the-counter or prescription medicines for pain, discomfort, or fever as directed by your caregiver. SEEK IMMEDIATE MEDICAL CARE IF:   Your pain and swelling increase, or pain is not controlled with medications.  You have loss of feeling in your foot or your foot turns cold or blue.  You develop new, unexplained symptoms, or an increase of the symptoms that brought you to your caregiver. MAKE SURE YOU:   Understand these instructions.  Will watch your condition.  Will get help right away if you are not doing well or get worse. Document Released: 01/03/2002 Document Revised: 10/06/2011 Document Reviewed: 03/02/2008 [facility] Patient Information 2015 [place], [place]. This information is not intended to replace  advice given to you by your health care provider. Make sure you discuss any questions you have with your health care provider.

## 2015-03-28 DIAGNOSIS — E785 Hyperlipidemia, unspecified: Secondary | ICD-10-CM | POA: Insufficient documentation

## 2016-02-20 ENCOUNTER — Encounter: Payer: Self-pay | Admitting: *Deleted

## 2016-02-20 ENCOUNTER — Emergency Department (INDEPENDENT_AMBULATORY_CARE_PROVIDER_SITE_OTHER): Payer: BLUE CROSS/BLUE SHIELD

## 2016-02-20 ENCOUNTER — Emergency Department
Admission: EM | Admit: 2016-02-20 | Discharge: 2016-02-20 | Disposition: A | Discharge status: Home / Self Care | Payer: BLUE CROSS/BLUE SHIELD | Source: Home / Self Care | Attending: Family Medicine | Admitting: Family Medicine

## 2016-02-20 DIAGNOSIS — M25862 Other specified joint disorders, left knee: Secondary | ICD-10-CM

## 2016-02-20 DIAGNOSIS — M25562 Pain in left knee: Secondary | ICD-10-CM | POA: Diagnosis not present

## 2016-02-20 MED ORDER — PREDNISONE 20 MG PO TABS: ORAL_TABLET | ORAL | 0 refills | Status: DC

## 2016-02-20 NOTE — Discharge Instructions (Addendum)
Apply ice pack for 20 to 30 minutes, 3 to 4 times daily  Continue until pain decreases. Wear knee sleeve or brace.  Begin range of motion and stretching exercises.

## 2016-02-20 NOTE — ED Provider Notes (Signed)
[facility]    CSN: [id number] Arrival date & time: 02/20/16  4098  First Provider Contact:  First MD Initiated Contact with Patient 02/20/16 2023        History   Chief Complaint Chief Complaint  Patient presents with  . Knee Pain    HPI Brenda Burton is a 45 y.o. female.   Patient complains of pain in her left knee for about 3 months, worse at night.  She recalls no injury.    Knee Pain  Location:  Knee Time since incident:  3 months Injury: no   Knee location:  L knee Pain details:    Quality:  Aching   Radiates to:  Does not radiate   Severity:  Moderate   Onset quality:  Gradual   Duration:  3 weeks   Timing:  Constant   Progression:  Worsening Chronicity:  Chronic Dislocation: no   Prior injury to area:  No Relieved by:  Nothing Worsened by:  Bearing weight and extension Ineffective treatments:  NSAIDs Associated symptoms: muscle weakness and stiffness   Associated symptoms: no back pain, no decreased ROM, no fatigue, no fever, no numbness, no swelling and no tingling     Past Medical History:  Diagnosis Date  . ADHD (attention deficit hyperactivity disorder)   . Asthma   . Migraine   . Psoriatic arthritis (HCC)   . Seasonal allergies   . Thyroid disease     Patient Active Problem List   Diagnosis Date Noted  . ADHD 10/21/2006  . TMJ PAIN 10/21/2006  . COUGH 08/05/2006  . ALLERGIC RHINITIS, SEASONAL 05/28/2006  . DEPRESSION, MAJOR, RECURRENT 05/05/2006  . MIGRAINE, UNSPEC., W/O INTRACTABLE MIGRAINE 05/05/2006    Past Surgical History:  Procedure Laterality Date  . APPENDECTOMY    . DILATION AND CURETTAGE OF UTERUS    . GANGLION CYST EXCISION     right wrist  . HERNIA REPAIR    . OVARIAN CYST REMOVAL    . SHOULDER SURGERY     right   . TONSILLECTOMY      OB History    Gravida Para Term Preterm AB Living   1             SAB TAB Ectopic Multiple Live Births                   Home Medications    Prior to  Admission medications   Medication Sig Start Date End Date Taking? Authorizing Provider  DULoxetine (CYMBALTA) 20 MG capsule Take 20 mg by mouth daily.   Yes Historical Provider, MD  loratadine (CLARITIN) 10 MG tablet Take 10 mg by mouth daily.   Yes Historical Provider, MD  methocarbamol (ROBAXIN) 750 MG tablet Take 750 mg by mouth 3 (three) times daily.   Yes Historical Provider, MD  montelukast (SINGULAIR) 10 MG tablet Take 10 mg by mouth at bedtime.   Yes Historical Provider, MD  albuterol (PROVENTIL HFA;VENTOLIN HFA) 108 (90 BASE) MCG/ACT inhaler Inhale 2 puffs into the lungs every 4 (four) hours as needed for wheezing or shortness of breath. 10/21/14   Lattie Haw, MD  Melatonin 10 MG TABS Take 1 tablet by mouth at bedtime.    Historical Provider, MD  norethindrone-ethinyl estradiol (JUNEL FE,GILDESS FE,LOESTRIN FE) 1-20 MG-MCG tablet Take 1 tablet by mouth daily.    Historical Provider, MD  ondansetron (ZOFRAN) 4 MG tablet Take 1 tablet (4 mg total) by mouth every 8 (eight) hours as  needed for nausea or vomiting. 07/24/14   Lajean Manes, MD  predniSONE (DELTASONE) 20 MG tablet Take one tab by mouth twice daily for 5 days, then one daily. Take with food. 02/20/16   Lattie Haw, MD  Prenatal Vit-Fe Fum-FA-Omega (PRENATAL MULTI +DHA) 27-0.8-228 MG CAPS Take 1 capsule by mouth daily.    Historical Provider, MD    Family History Family History  Problem Relation Age of Onset  . Migraines Mother   . GER disease Mother   . Narcolepsy Mother   . Restless legs syndrome Mother     Social History Social History  Substance Use Topics  . Smoking status: Never Smoker  . Smokeless tobacco: Not on file  . Alcohol use No     Allergies   Bactrim; Biaxin [clarithromycin]; Clarithromycin; Dilaudid [hydromorphone hcl]; Hydromorphone hcl; Imitrex [sumatriptan]; Nalbuphine; and Penicillins   Review of Systems Review of Systems  Constitutional: Negative for fatigue and fever.    Musculoskeletal: Positive for stiffness. Negative for back pain.  All other systems reviewed and are negative.    Physical Exam Triage Vital Signs ED Triage Vitals  Enc Vitals Group     BP 02/20/16 1947 149/97     Pulse Rate 02/20/16 1947 92     Resp 02/20/16 1947 16     Temp 02/20/16 1947 98.5 F (36.9 C)     Temp Source 02/20/16 1947 Oral     SpO2 02/20/16 1947 97 %     Weight 02/20/16 1947 178 lb (80.7 kg)     Height 02/20/16 1947 5\' 5"  (1.651 m)     Head Circumference --      Peak Flow --      Pain Score 02/20/16 1950 10     Pain Loc --      Pain Edu? --      Excl. in GC? --    No data found.   Updated Vital Signs BP 149/97 (BP Location: Left Arm)   Pulse 92   Temp 98.5 F (36.9 C) (Oral)   Resp 16   Ht 5\' 5"  (1.651 m)   Wt 178 lb (80.7 kg)   LMP 01/28/2016   SpO2 97%   BMI 29.62 kg/m   Visual Acuity Right Eye Distance:   Left Eye Distance:   Bilateral Distance:    Right Eye Near:   Left Eye Near:    Bilateral Near:     Physical Exam  Constitutional: She appears well-developed and well-nourished. No distress.  HENT:  Head: Normocephalic.  Eyes: Pupils are equal, round, and reactive to light.  Neck: Normal range of motion.  Cardiovascular: Normal rate.   Pulmonary/Chest: Effort normal.  Musculoskeletal:       Left knee: She exhibits normal range of motion, no swelling, no effusion, no ecchymosis, no deformity, no erythema, normal alignment, no LCL laxity and normal patellar mobility. Tenderness found.       Legs:  Left knee:  No effusion, erythema, or warmth.  Knee stable, negative drawer test.  McMurray test negative.  There is tenderness to palpation along medial and lateral edges of patella.  Neurological: She is alert.  Skin: Skin is warm and dry.  Nursing note and vitals reviewed.    UC Treatments / Results  Labs (all labs ordered are listed, but only abnormal results are displayed) Labs Reviewed - No data to display  EKG  EKG  Interpretation None       Radiology Dg Knee Complete 4 Views Left  Result Date: 02/20/2016 CLINICAL DATA:  Left knee pain x3 weeks, no known injury EXAM: LEFT KNEE - COMPLETE 4+ VIEW COMPARISON:  None. FINDINGS: No fracture or dislocation is seen. The joint spaces are preserved. The visualized soft tissues are unremarkable. IMPRESSION: No fracture or dislocation is seen. Electronically Signed   By: Charline Bills M.D.   On: 02/20/2016 20:04   Procedures Procedures (including critical care time)  Medications Ordered in UC Medications - No data to display   Initial Impression / Assessment and Plan / UC Course  I have reviewed the triage vital signs and the nursing notes.  Pertinent labs & imaging results that were available during my care of the patient were reviewed by me and considered in my medical decision making (see chart for details).  Clinical Course      Final Clinical Impressions(s) / UC Diagnoses   Final diagnoses:  Patellofemoral dysfunction of left knee     Begin prednisone burst/taper. Apply ice pack for 20 to 30 minutes, 3 to 4 times daily  Continue until pain decreases. Obtain wear knee sleeve or brace.  Begin range of motion and stretching exercises. Followup with Dr. Rodney Langton or Dr. Clementeen Graham ([facility]) for further management. New Prescriptions New Prescriptions   PREDNISONE (DELTASONE) 20 MG TABLET    Take one tab by mouth twice daily for 5 days, then one daily. Take with food.     Lattie Haw, MD 03/20/16 1149

## 2016-02-20 NOTE — ED Triage Notes (Signed)
Pt c/o LT knee pain for a "few months", worse x 3 wks. Denies trauma. She has taken IBF 600mg  prn without relief.

## 2016-02-22 ENCOUNTER — Ambulatory Visit (INDEPENDENT_AMBULATORY_CARE_PROVIDER_SITE_OTHER): Payer: BLUE CROSS/BLUE SHIELD | Admitting: Sports Medicine

## 2016-02-22 ENCOUNTER — Encounter: Payer: Self-pay | Admitting: Sports Medicine

## 2016-02-22 DIAGNOSIS — M25562 Pain in left knee: Secondary | ICD-10-CM

## 2016-02-22 DIAGNOSIS — M79671 Pain in right foot: Secondary | ICD-10-CM | POA: Diagnosis not present

## 2016-02-22 MED ORDER — MELOXICAM 15 MG PO TABS: ORAL_TABLET | ORAL | 3 refills | Status: DC

## 2016-02-22 NOTE — Assessment & Plan Note (Signed)
Metatarsalgia at the second MTP. Also has some pain on the dorsum of the metatarsal suggestive of a stress injury. X-rays were negative, pain greater than 2 months despite conservative measures. She has seen a podiatrist who placed her in a boot. She will return for custom orthotics with metatarsal pad, we are also going to get an MRI of the foot. Continue boot for now.

## 2016-02-22 NOTE — Assessment & Plan Note (Signed)
Likely meniscal tear, medial. X-rays were negative, pain has been present for greater than 2 months despite conservative measures. Adding meloxicam, MRI.

## 2016-02-22 NOTE — Progress Notes (Signed)
   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 She had seen a podiatrist and was placed in a boot. Symptoms improved only slightly, subsequent only she started to have pain in her left knee, posterior medial aspect, fairly severe. No swelling. She was then placed in a knee brace by urgent care. She does have x-rays already that were both negative for any abnormalities. Has never had advanced imaging, pain is moderate, persistent, and she is seeking essentially a second opinion.  Past medical history, Surgical history, Family history not pertinant except as noted below, Social history, Allergies, and medications have been entered into the medical record, reviewed, and no changes needed.   Review of Systems: No headache, visual changes, nausea, vomiting, diarrhea, constipation, dizziness, abdominal pain, skin rash, fevers, chills, night sweats, weight loss, swollen lymph nodes, body aches, joint swelling, muscle aches, chest pain, shortness of breath, mood changes, visual or auditory hallucinations.   Objective:   General: Well Developed, well nourished, and in no acute distress.  Neuro/Psych: Alert and oriented x3, extra-ocular muscles intact, able to move all 4 extremities, sensation grossly intact. Skin: Warm and dry, no rashes noted.  Respiratory: Not using accessory muscles, speaking in full sentences, trachea midline.  Cardiovascular: Pulses palpable, no extremity edema. Abdomen: Does not appear distended. Right Foot: No visible erythema or swelling. Range of motion is full in all directions. Strength is 5/5 in all directions. No hallux valgus. No pes cavus or pes planus. No abnormal callus noted. No pain over the  navicular prominence, or base of fifth metatarsal. No tenderness to palpation of the calcaneal insertion of plantar fascia. No pain at the Achilles insertion. No pain over the calcaneal bursa. No pain of the retrocalcaneal bursa. Tender to palpation over the plantar aspect of the second MTP, also tenderness over the dorsum of the second and third MT shafts No hallux rigidus or limitus. No tenderness palpation over interphalangeal joints. No pain with compression of the metatarsal heads. Neurovascularly intact distally. Left Knee: Normal to inspection with no erythema or effusion or obvious bony abnormalities. Tender to palpation at the posterior medial joint line. ROM normal in flexion and extension and lower leg rotation. Ligaments with solid consistent endpoints including ACL, PCL, LCL, MCL. Negative Mcmurray's and provocative meniscal tests. Non painful patellar compression. Patellar and quadriceps tendons unremarkable. Hamstring and quadriceps strength is normal.  Impression and Recommendations:   This case required medical decision making of moderate complexity.  Left knee pain Likely meniscal tear, medial. X-rays were negative, pain has been present for greater than 2 months despite conservative measures. Adding meloxicam, MRI.  Right foot pain Metatarsalgia at the second MTP. Also has some pain on the dorsum of the metatarsal suggestive of a stress injury. X-rays were negative, pain greater than 2 months despite conservative measures. She has seen a podiatrist who placed her in a boot. She will return for custom orthotics with metatarsal pad, we are also going to get an MRI of the foot. Continue boot for now.

## 2016-02-28 ENCOUNTER — Ambulatory Visit (INDEPENDENT_AMBULATORY_CARE_PROVIDER_SITE_OTHER): Payer: BLUE CROSS/BLUE SHIELD | Admitting: Sports Medicine

## 2016-02-28 DIAGNOSIS — M79671 Pain in right foot: Secondary | ICD-10-CM

## 2016-02-28 NOTE — Assessment & Plan Note (Signed)
Second MTP metatarsalgia has improved significantly. There is also some pain at the dorsum of the metatarsal suggesting stress injury, awaiting MRI on Monday. Custom orthotics as above. Return to go over MRI results next week.

## 2016-02-28 NOTE — Progress Notes (Signed)

## 2016-03-03 ENCOUNTER — Ambulatory Visit (INDEPENDENT_AMBULATORY_CARE_PROVIDER_SITE_OTHER): Payer: BLUE CROSS/BLUE SHIELD

## 2016-03-03 DIAGNOSIS — M25462 Effusion, left knee: Secondary | ICD-10-CM | POA: Diagnosis not present

## 2016-03-03 DIAGNOSIS — M79671 Pain in right foot: Secondary | ICD-10-CM | POA: Diagnosis not present

## 2016-03-03 DIAGNOSIS — M25562 Pain in left knee: Secondary | ICD-10-CM

## 2016-03-07 ENCOUNTER — Ambulatory Visit: Payer: Self-pay | Admitting: Sports Medicine

## 2016-06-10 DIAGNOSIS — R002 Palpitations: Secondary | ICD-10-CM | POA: Insufficient documentation

## 2016-06-23 DIAGNOSIS — I10 Essential (primary) hypertension: Secondary | ICD-10-CM | POA: Insufficient documentation

## 2016-12-09 DIAGNOSIS — Z79899 Other long term (current) drug therapy: Secondary | ICD-10-CM | POA: Insufficient documentation

## 2016-12-30 ENCOUNTER — Ambulatory Visit (HOSPITAL_COMMUNITY): Payer: Self-pay | Admitting: Psychiatry

## 2017-02-23 ENCOUNTER — Ambulatory Visit (INDEPENDENT_AMBULATORY_CARE_PROVIDER_SITE_OTHER): Payer: BLUE CROSS/BLUE SHIELD | Admitting: Psychiatry

## 2017-02-23 ENCOUNTER — Encounter (HOSPITAL_COMMUNITY): Payer: Self-pay | Admitting: Psychiatry

## 2017-02-23 VITALS — BP 130/76 | HR 82 | Resp 16 | Ht 64.3 in | Wt 190.0 lb

## 2017-02-23 DIAGNOSIS — F411 Generalized anxiety disorder: Secondary | ICD-10-CM

## 2017-02-23 DIAGNOSIS — F331 Major depressive disorder, recurrent, moderate: Secondary | ICD-10-CM | POA: Diagnosis not present

## 2017-02-23 DIAGNOSIS — Z811 Family history of alcohol abuse and dependence: Secondary | ICD-10-CM

## 2017-02-23 DIAGNOSIS — Z818 Family history of other mental and behavioral disorders: Secondary | ICD-10-CM | POA: Diagnosis not present

## 2017-02-23 DIAGNOSIS — F909 Attention-deficit hyperactivity disorder, unspecified type: Secondary | ICD-10-CM

## 2017-02-23 MED ORDER — ARIPIPRAZOLE 5 MG PO TABS: 5.0000 mg | ORAL_TABLET | Freq: Every day | ORAL | 0 refills | Status: DC

## 2017-02-23 MED ORDER — DULOXETINE HCL 30 MG PO CPEP: 30.0000 mg | ORAL_CAPSULE | Freq: Every day | ORAL | 0 refills | Status: DC
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
## 2017-03-19 ENCOUNTER — Ambulatory Visit (INDEPENDENT_AMBULATORY_CARE_PROVIDER_SITE_OTHER): Payer: BLUE CROSS/BLUE SHIELD | Admitting: Licensed Clinical Social Worker

## 2017-03-19 DIAGNOSIS — F411 Generalized anxiety disorder: Secondary | ICD-10-CM

## 2017-03-19 DIAGNOSIS — F332 Major depressive disorder, recurrent severe without psychotic features: Secondary | ICD-10-CM

## 2017-03-19 DIAGNOSIS — Z8659 Personal history of other mental and behavioral disorders: Secondary | ICD-10-CM | POA: Diagnosis not present

## 2017-03-20 ENCOUNTER — Encounter (HOSPITAL_COMMUNITY): Payer: Self-pay | Admitting: Licensed Clinical Social Worker
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
## 2017-03-22 ENCOUNTER — Other Ambulatory Visit (HOSPITAL_COMMUNITY): Payer: Self-pay | Admitting: Psychiatry

## 2017-03-23 ENCOUNTER — Encounter (HOSPITAL_COMMUNITY): Payer: Self-pay | Admitting: Psychiatry

## 2017-03-23 ENCOUNTER — Ambulatory Visit (INDEPENDENT_AMBULATORY_CARE_PROVIDER_SITE_OTHER): Payer: BLUE CROSS/BLUE SHIELD | Admitting: Psychiatry

## 2017-03-23 DIAGNOSIS — F331 Major depressive disorder, recurrent, moderate: Secondary | ICD-10-CM

## 2017-03-23 DIAGNOSIS — Z6379 Other stressful life events affecting family and household: Secondary | ICD-10-CM | POA: Diagnosis not present

## 2017-03-23 DIAGNOSIS — G4700 Insomnia, unspecified: Secondary | ICD-10-CM

## 2017-03-23 DIAGNOSIS — Z8659 Personal history of other mental and behavioral disorders: Secondary | ICD-10-CM | POA: Diagnosis not present

## 2017-03-23 DIAGNOSIS — F909 Attention-deficit hyperactivity disorder, unspecified type: Secondary | ICD-10-CM

## 2017-03-23 DIAGNOSIS — F411 Generalized anxiety disorder: Secondary | ICD-10-CM | POA: Diagnosis not present

## 2017-03-23 DIAGNOSIS — Z818 Family history of other mental and behavioral disorders: Secondary | ICD-10-CM | POA: Diagnosis not present

## 2017-03-23 DIAGNOSIS — Z811 Family history of alcohol abuse and dependence: Secondary | ICD-10-CM

## 2017-03-23 MED ORDER — BUPROPION HCL ER (SR) 100 MG PO TB12: 100.0000 mg | ORAL_TABLET | Freq: Every day | ORAL | 0 refills | Status: DC

## 2017-03-23 MED ORDER — ARIPIPRAZOLE 10 MG PO TABS: 10.0000 mg | ORAL_TABLET | Freq: Every day | ORAL | 0 refills | Status: DC

## 2017-03-23 MED ORDER — DULOXETINE HCL 30 MG PO CPEP: 30.0000 mg | ORAL_CAPSULE | Freq: Every day | ORAL | 0 refills | Status: DC

## 2017-03-23 MED ORDER — DULOXETINE HCL 60 MG PO CPEP: 60.0000 mg | ORAL_CAPSULE | Freq: Every day | ORAL | 0 refills | Status: DC

## 2017-03-23 NOTE — Progress Notes (Signed)
[facility] Outpatient Follow up visit   Patient Identification: Brenda Burton MRN:  [id number] Date of Evaluation:  03/23/2017 Referral Source: primary care Chief Complaint:   Chief Complaint    Follow-up     Visit Diagnosis:    ICD-10-CM   1. Moderate episode of recurrent major depressive disorder (HCC) F33.1   2. GAD (generalized anxiety disorder) F41.1   3. History of ADHD Z86.59     History of Present Illness:  46 years old currently single Caucasian female initially referred by primary care physician for management of depression and anxiety  Patient diagnoses of mood disorder and depression she has been on different medications the past currently she is on Cymbalta 60 mg she still endorsing down and depressed is getting anxious easily difficulty to balance life she is working full-time she is a single mom thinking of 3 kids 2 of them are special needs. She has limited support from their dad. She is trying to figure out about her youngest son who is autistic  She is getting and feeling distracted last visit we have added Abilify for mood symptoms that has helped but now she is again feeling somewhat down gets easily distracted and that may affect her functionality she starts things and cannot finish that. She is looking for to start back her job tomorrow she has a cardiology surgical procedure for SVT still has some symptoms but says it'll take time or they may have to do it again. That is the reason we are trying to avoid any stimulants as of now   Medical complexity: arthrits, PVC. ( seeing a cardiologist and pulmonologist) Aggravating factors; work ,relationship, kids at time Modifying factors; kids again. mom Severity of depression;  5.5/10     Past Psychiatric History: depression, mood symptoms, anxiety   Previous Psychotropic Medications: Yes   Substance Abuse History in the last 12 months:  No.  Consequences of Substance Abuse: NA  Past Medical History:  Past Medical  History:  Diagnosis Date  . ADHD (attention deficit hyperactivity disorder)   . Asthma   . Migraine   . Psoriatic arthritis (HCC)   . Seasonal allergies   . Thyroid disease     Past Surgical History:  Procedure Laterality Date  . APPENDECTOMY    . DILATION AND CURETTAGE OF UTERUS    . GANGLION CYST EXCISION     right wrist  . HERNIA REPAIR    . OVARIAN CYST REMOVAL    . SHOULDER SURGERY     right   . TONSILLECTOMY      Family Psychiatric History: Mom, uncle: depression. Dad : alcoholic  Family History:  Family History  Problem Relation Age of Onset  . Migraines Mother   . GER disease Mother   . Narcolepsy Mother   . Restless legs syndrome Mother     Social History:   Social History   Social History  . Marital status: Divorced    Spouse name: N/A  . Number of children: N/A  . Years of education: N/A   Social History Main Topics  . Smoking status: Never Smoker  . Smokeless tobacco: Never Used  . Alcohol use No  . Drug use: No  . Sexual activity: Yes    Birth control/ protection: Surgical   Other Topics Concern  . None   Social History Narrative  . None     Allergies:   Allergies  Allergen Reactions  . Bactrim   . Biaxin [Clarithromycin]   . Clarithromycin   .  Dilaudid [Hydromorphone Hcl]   . Hydromorphone Hcl     REACTION: pruritis  . Imitrex [Sumatriptan]   . Nalbuphine     REACTION: pruritis  . Penicillins     REACTION: rash,hives    Metabolic Disorder Labs: No results found for: HGBA1C, MPG No results found for: PROLACTIN No results found for: CHOL, TRIG, HDL, CHOLHDL, VLDL, LDLCALC   Current Medications: Current Outpatient Prescriptions  Medication Sig Dispense Refill  . albuterol (PROVENTIL HFA;VENTOLIN HFA) 108 (90 BASE) MCG/ACT inhaler Inhale 2 puffs into the lungs every 4 (four) hours as needed for wheezing or shortness of breath. 1 Inhaler 0  . albuterol (PROVENTIL HFA;VENTOLIN HFA) 108 (90 Base) MCG/ACT inhaler Inhale into  the lungs.    . ARIPiprazole (ABILIFY) 10 MG tablet Take 1 tablet (10 mg total) by mouth daily. Take one a day 30 tablet 0  . aspirin EC 81 MG tablet Take by mouth.    Marland Kitchen buPROPion (WELLBUTRIN SR) 100 MG 12 hr tablet Take 1 tablet (100 mg total) by mouth daily. 30 tablet 0  . DULoxetine (CYMBALTA) 30 MG capsule Take 1 capsule (30 mg total) by mouth daily. 30 capsule 0  . DULoxetine (CYMBALTA) 60 MG capsule Take 1 capsule (60 mg total) by mouth daily. 30 capsule 0  . ipratropium-albuterol (DUONEB) 0.5-2.5 (3) MG/3ML SOLN 3 mLs.    Marland Kitchen levothyroxine (SYNTHROID, LEVOTHROID) 25 MCG tablet Take by mouth.    . loratadine (CLARITIN) 10 MG tablet Take 10 mg by mouth daily.    Marland Kitchen losartan (COZAAR) 25 MG tablet TAKE ONE TABLET BY MOUTH ONCE DAILY    . meloxicam (MOBIC) 15 MG tablet One tab PO qAM with breakfast for 2 weeks, then daily prn pain. 30 tablet 3  . methocarbamol (ROBAXIN) 750 MG tablet Take 750 mg by mouth 3 (three) times daily.    . methocarbamol (ROBAXIN) 750 MG tablet Take 750 mg by mouth.    . mometasone-formoterol (DULERA) 200-5 MCG/ACT AERO 2 puffs 2 (two) times daily.     . montelukast (SINGULAIR) 10 MG tablet Take 10 mg by mouth at bedtime.    . montelukast (SINGULAIR) 10 MG tablet Take 10 mg by mouth Nightly.    . norethindrone-ethinyl estradiol (JUNEL FE,GILDESS FE,LOESTRIN FE) 1-20 MG-MCG tablet Take 1 tablet by mouth daily.    . Prenatal Vit-Fe Fum-FA-Omega (PRENATAL MULTI +DHA) 27-0.8-228 MG CAPS Take 1 capsule by mouth daily.     No current facility-administered medications for this visit.       Psychiatric Specialty Exam: Review of Systems  Cardiovascular: Negative for palpitations.  Skin: Negative for rash.  Psychiatric/Behavioral: Positive for depression. Negative for substance abuse. The patient is nervous/anxious.     unknown if currently breastfeeding.There is no height or weight on file to calculate BMI.  General Appearance: Casual  Eye Contact:  Fair  Speech:   Normal Rate  Volume:  Normal  Mood:  Less dysphoric  Affect: congruent  Thought Process:  Goal Directed  Orientation:  Full (Time, Place, and Person)  Thought Content:  Rumination  Suicidal Thoughts:  No  Homicidal Thoughts:  No  Memory:  Immediate;   Fair Recent;   Fair  Judgement:  Fair  Insight:  Shallow  Psychomotor Activity:  Decreased  Concentration:  Concentration: Fair and Attention Span: Fair  Recall:  [employer] of Knowledge:Fair  Language: Fair  Akathisia:  Negative  Handed:  Right  AIMS (if indicated):    Assets:  Desire for Improvement  ADL's:  Intact  Cognition: WNL  Sleep:  fair    Treatment Plan Summary: Medication management and Plan as follows  1. Major depression, recurrent moderate: effected by work and being single mom. : continue cymalta 60 plus 30mg  Increase abilify to 10mg , still keeping a low dose of 10mg .   2. GAd: fluctuates. Continue cymbalta.   3. Sleep: baseline  4. ADHD: prior diagnosis. Will add wellbutrin 100mg  sr. Hold off stimulants as she is getting SVT treatment.   No clear history of bipolar , abilify has helped depression will adjust as above  continue therapy to deal with stress, job, single mom   No otherwise side effects reported  FU 4-6 weeks or earlier if needed    Thresa Ross, MD 8/27/20184:21 PM

## 2017-04-20 ENCOUNTER — Other Ambulatory Visit (HOSPITAL_COMMUNITY): Payer: Self-pay | Admitting: Psychiatry

## 2017-04-21 ENCOUNTER — Ambulatory Visit (HOSPITAL_COMMUNITY): Payer: Self-pay | Admitting: Licensed Clinical Social Worker

## 2017-04-22 NOTE — Telephone Encounter (Signed)
Medication refill- received fax from [facility] requesting refills for Cymbalta, Wellbutrin and Abilify. Per Dr. Gilmore Laroche, refills are authorize for Cymbalta , #30, Wellbutrin , #30 and Abilify , #30. Rx's was sent to pharmacy. Pt next apt is schedule on 04/30/17. Rx's were sent to pharmacy. Lvm informing pt of refill status.

## 2017-04-30 ENCOUNTER — Ambulatory Visit (HOSPITAL_COMMUNITY): Payer: Self-pay | Admitting: Psychiatry

## 2017-05-07 ENCOUNTER — Ambulatory Visit (HOSPITAL_COMMUNITY): Payer: Self-pay | Admitting: Psychiatry

## 2017-05-25 ENCOUNTER — Other Ambulatory Visit (HOSPITAL_COMMUNITY): Payer: Self-pay | Admitting: Psychiatry

## 2017-05-28 ENCOUNTER — Ambulatory Visit (INDEPENDENT_AMBULATORY_CARE_PROVIDER_SITE_OTHER): Payer: BLUE CROSS/BLUE SHIELD | Admitting: Psychiatry

## 2017-05-28 ENCOUNTER — Encounter (HOSPITAL_COMMUNITY): Payer: Self-pay | Admitting: Psychiatry

## 2017-05-28 VITALS — BP 118/66 | HR 94 | Resp 16 | Ht 64.3 in | Wt 195.0 lb

## 2017-05-28 DIAGNOSIS — Z818 Family history of other mental and behavioral disorders: Secondary | ICD-10-CM

## 2017-05-28 DIAGNOSIS — Z8659 Personal history of other mental and behavioral disorders: Secondary | ICD-10-CM

## 2017-05-28 DIAGNOSIS — F411 Generalized anxiety disorder: Secondary | ICD-10-CM | POA: Diagnosis not present

## 2017-05-28 DIAGNOSIS — G4710 Hypersomnia, unspecified: Secondary | ICD-10-CM

## 2017-05-28 DIAGNOSIS — F331 Major depressive disorder, recurrent, moderate: Secondary | ICD-10-CM

## 2017-05-28 DIAGNOSIS — Z636 Dependent relative needing care at home: Secondary | ICD-10-CM

## 2017-05-28 DIAGNOSIS — F909 Attention-deficit hyperactivity disorder, unspecified type: Secondary | ICD-10-CM | POA: Diagnosis not present

## 2017-05-28 DIAGNOSIS — Z79899 Other long term (current) drug therapy: Secondary | ICD-10-CM | POA: Diagnosis not present

## 2017-05-28 MED ORDER — DULOXETINE HCL 60 MG PO CPEP: 60.0000 mg | ORAL_CAPSULE | Freq: Every day | ORAL | 1 refills | Status: DC

## 2017-05-28 MED ORDER — BUPROPION HCL ER (SR) 100 MG PO TB12: 100.0000 mg | ORAL_TABLET | Freq: Every day | ORAL | 1 refills | Status: DC

## 2017-05-28 MED ORDER — ARIPIPRAZOLE 10 MG PO TABS: 10.0000 mg | ORAL_TABLET | Freq: Every day | ORAL | 1 refills | Status: DC

## 2017-05-28 MED ORDER — DULOXETINE HCL 30 MG PO CPEP: 30.0000 mg | ORAL_CAPSULE | Freq: Every day | ORAL | 1 refills | Status: DC

## 2017-05-28 NOTE — Progress Notes (Signed)
[facility] Outpatient Follow up visit   Patient Identification: REBBIE LAURICELLA MRN:  [id number] Date of Evaluation:  05/28/2017 Referral Source: primary care Chief Complaint:   Chief Complaint    Follow-up     Visit Diagnosis:    ICD-10-CM   1. Moderate episode of recurrent major depressive disorder (HCC) F33.1   2. GAD (generalized anxiety disorder) F41.1   3. History of ADHD Z86.59     History of Present Illness:  46 years old currently single Caucasian female initially referred by primary care physician for management of depression and anxiety  Patient diagnoses of mood disorder and depression she has been on different medications the past currently she is on Cymbalta 60 mg she still endorsing down and depressed is getting anxious easily difficulty to balance life she is working full-time she is a single mom thinking of 3 kids 2 of them are special needs. She has limited support from their dad. She is trying to figure out about her youngest son who is autistic   Added wellbutrin last visit that has helped some distraction. abilify was incrased.  Pending sleep studyl; feels tired. Ok at work functioning Stress is youngest kid who has behavioural issue and difficult to manage. Going to see provider in [place] tomorrow   Medical complexity: arthrits, PVC. ( seeing a cardiologist and pulmonologist) Aggravating factors; work, past relationships Modifying factors; kids again. Mom Severity of depression; 6/10.      Past Psychiatric History: depression, mood symptoms, anxiety   Previous Psychotropic Medications: Yes   Substance Abuse History in the last 12 months:  No.  Consequences of Substance Abuse: NA  Past Medical History:  Past Medical History:  Diagnosis Date  . ADHD (attention deficit hyperactivity disorder)   . Asthma   . Migraine   . Psoriatic arthritis (HCC)   . Seasonal allergies   . Thyroid disease     Past Surgical History:  Procedure Laterality Date  .  APPENDECTOMY    . DILATION AND CURETTAGE OF UTERUS    . GANGLION CYST EXCISION     right wrist  . HERNIA REPAIR    . OVARIAN CYST REMOVAL    . SHOULDER SURGERY     right   . TONSILLECTOMY      Family Psychiatric History: Mom, uncle: depression. Dad : alcoholic  Family History:  Family History  Problem Relation Age of Onset  . Migraines Mother   . GER disease Mother   . Narcolepsy Mother   . Restless legs syndrome Mother     Social History:   Social History   Social History  . Marital status: Divorced    Spouse name: N/A  . Number of children: N/A  . Years of education: N/A   Social History Main Topics  . Smoking status: Never Smoker  . Smokeless tobacco: Never Used  . Alcohol use No  . Drug use: No  . Sexual activity: Not Currently    Partners: Male    Birth control/ protection: Pill   Other Topics Concern  . None   Social History Narrative  . None     Allergies:   Allergies  Allergen Reactions  . Bactrim   . Biaxin [Clarithromycin]   . Clarithromycin   . Dilaudid [Hydromorphone Hcl]   . Hydromorphone Hcl     REACTION: pruritis  . Imitrex [Sumatriptan]   . Nalbuphine     REACTION: pruritis  . Penicillins     REACTION: rash,hives    Metabolic  Disorder Labs: No results found for: HGBA1C, MPG No results found for: PROLACTIN No results found for: CHOL, TRIG, HDL, CHOLHDL, VLDL, LDLCALC   Current Medications: Current Outpatient Prescriptions  Medication Sig Dispense Refill  . albuterol (PROVENTIL HFA;VENTOLIN HFA) 108 (90 BASE) MCG/ACT inhaler Inhale 2 puffs into the lungs every 4 (four) hours as needed for wheezing or shortness of breath. 1 Inhaler 0  . albuterol (PROVENTIL HFA;VENTOLIN HFA) 108 (90 Base) MCG/ACT inhaler Inhale into the lungs.    . ARIPiprazole (ABILIFY) 10 MG tablet Take 1 tablet (10 mg total) by mouth daily. 30 tablet 1  . aspirin EC 81 MG tablet Take by mouth.    Marland Kitchen buPROPion (WELLBUTRIN SR) 100 MG 12 hr tablet Take 1 tablet  (100 mg total) by mouth daily. 30 tablet 1  . DULoxetine (CYMBALTA) 30 MG capsule Take 1 capsule (30 mg total) by mouth daily. 30 capsule 1  . DULoxetine (CYMBALTA) 60 MG capsule Take 1 capsule (60 mg total) by mouth daily. 30 capsule 1  . ipratropium-albuterol (DUONEB) 0.5-2.5 (3) MG/3ML SOLN 3 mLs.    Marland Kitchen levothyroxine (SYNTHROID, LEVOTHROID) 25 MCG tablet Take by mouth.    . loratadine (CLARITIN) 10 MG tablet Take 10 mg by mouth daily.    Marland Kitchen losartan (COZAAR) 25 MG tablet TAKE ONE TABLET BY MOUTH ONCE DAILY    . meloxicam (MOBIC) 15 MG tablet One tab PO qAM with breakfast for 2 weeks, then daily prn pain. 30 tablet 3  . methocarbamol (ROBAXIN) 750 MG tablet Take 750 mg by mouth 3 (three) times daily.    . methocarbamol (ROBAXIN) 750 MG tablet Take 750 mg by mouth.    . mometasone-formoterol (DULERA) 200-5 MCG/ACT AERO 2 puffs 2 (two) times daily.     . montelukast (SINGULAIR) 10 MG tablet Take 10 mg by mouth at bedtime.    . montelukast (SINGULAIR) 10 MG tablet Take 10 mg by mouth Nightly.    . norethindrone-ethinyl estradiol (JUNEL FE,GILDESS FE,LOESTRIN FE) 1-20 MG-MCG tablet Take 1 tablet by mouth daily.    . Prenatal Vit-Fe Fum-FA-Omega (PRENATAL MULTI +DHA) 27-0.8-228 MG CAPS Take 1 capsule by mouth daily.     No current facility-administered medications for this visit.       Psychiatric Specialty Exam: Review of Systems  Cardiovascular: Negative for chest pain.  Skin: Negative for rash.  Psychiatric/Behavioral: Negative for substance abuse.    Blood pressure 118/66, pulse 94, resp. rate 16, height 5' 4.3" (1.633 m), weight 195 lb (88.5 kg), last menstrual period 05/24/2017, SpO2 94 %, unknown if currently breastfeeding.Body mass index is 33.16 kg/m.  General Appearance: Casual  Eye Contact:  Fair  Speech:  Normal Rate  Volume:  Normal  Mood:  Less distracted somewhat sad  Affect: congruent  Thought Process:  Goal Directed  Orientation:  Full (Time, Place, and Person)   Thought Content:  Rumination  Suicidal Thoughts:  No  Homicidal Thoughts:  No  Memory:  Immediate;   Fair Recent;   Fair  Judgement:  Fair  Insight:  Shallow  Psychomotor Activity:  Decreased  Concentration:  Concentration: Fair and Attention Span: Fair  Recall:  [employer] of Knowledge:Fair  Language: Fair  Akathisia:  Negative  Handed:  Right  AIMS (if indicated):    Assets:  Desire for Improvement  ADL's:  Intact  Cognition: WNL  Sleep:  fair    Treatment Plan Summary: Medication management and Plan as follows  1. Major depression, recurrent moderate: effected by  work and being single mom.  Somewhat more calmer. Continue abilify and cymbalta. Work on increasing ME or outdoor time that has helped distraction from worries Adding wellbutrin has helped but change to am time for tiredness Sleep study pending 2. GAd: fluctuates. Continue cymbalta  3. Sleep: excessive. Pending sleep study. Change wellbutrin to daytime 4. ADHD: prior diagnosis. Not worse. Continue wellbutrin  Cardiology working on possible reblation for SVT/PVC  Questions addressed FU 2 months or earlier if needed    Thresa RossAKHTAR, Saxon Crosby, MD 11/1/20183:23 PM

## 2017-07-09 ENCOUNTER — Ambulatory Visit (INDEPENDENT_AMBULATORY_CARE_PROVIDER_SITE_OTHER): Payer: BLUE CROSS/BLUE SHIELD | Admitting: Licensed Clinical Social Worker

## 2017-07-09 DIAGNOSIS — F411 Generalized anxiety disorder: Secondary | ICD-10-CM | POA: Diagnosis not present

## 2017-07-09 DIAGNOSIS — F331 Major depressive disorder, recurrent, moderate: Secondary | ICD-10-CM

## 2017-07-09 NOTE — Progress Notes (Signed)
   THERAPIST PROGRESS NOTE  Session Time: 4:02pm-4:58pm  Participation Level: Active  Behavioral Response: CasualAlertAnxious  Type of Therapy: Individual Therapy  Treatment Goals addressed: Develop the ability to recognize, accept, and cope with feelings of depression (and anxiety)  Interventions: Assessment, treatment planning   Suicidal/Homicidal: Denied both  Therapist Interventions:  Gathered information about significant events since last seen about 3 months ago.   Had patient complete Burton PHQ9 and GAD7 to assess for severity of depression and anxiety symptoms.   Collaborated with patient to develop her treatment plan.  Summary:  Reported she had two surgeries, both cardiac ablations, the most recent being Dec 7th.  Glad to have these over with.  Feeling better.  Not having heart palpitations anymore.  Both depression and anxiety have slightly improved since last seen by this therapist in August:  Depression screen [facility] 2/9 07/09/2017 03/19/2017  Decreased Interest 3 3  Down, Depressed, Hopeless 3 3  PHQ - 2 Score 6 6  Altered sleeping 3 3  Tired, decreased energy 3 3  Change in appetite 1 3  Feeling bad or failure about yourself  3 3  Trouble concentrating 3 2  Moving slowly or fidgety/restless 0 2  Suicidal thoughts 0 0  PHQ-9 Score 19 22   GAD 7 : Generalized Anxiety Score 07/09/2017 03/19/2017  Nervous, Anxious, on Edge 3 3  Control/stop worrying 3 3  Worry too much - different things 3 3  Trouble relaxing 3 3  Restless 1 2  Easily annoyed or irritable 3 3  Afraid - awful might happen 0 2  Total GAD 7 Score 16 19  Anxiety Difficulty Very difficult Somewhat difficult    Decided to focus primarily on reducing symptoms of depression.  Acknowledged she needs to put more focus on caring for herself rather than "fixing others."     Plan: Planning to request the next 4pm appointment this therapist has available.  Diagnosis: Major Depressive Disorder, recurrent,  moderate                         GAD    Darrin LuisSolomon, Brenda Gelinas A, LCSW 07/09/2017

## 2017-07-13 ENCOUNTER — Encounter (HOSPITAL_COMMUNITY): Payer: Self-pay | Admitting: Psychiatry

## 2017-07-13 ENCOUNTER — Other Ambulatory Visit: Payer: Self-pay

## 2017-07-13 ENCOUNTER — Ambulatory Visit (INDEPENDENT_AMBULATORY_CARE_PROVIDER_SITE_OTHER): Payer: BLUE CROSS/BLUE SHIELD | Admitting: Psychiatry

## 2017-07-13 VITALS — BP 108/68 | HR 94 | Ht 64.3 in | Wt 198.0 lb

## 2017-07-13 DIAGNOSIS — F332 Major depressive disorder, recurrent severe without psychotic features: Secondary | ICD-10-CM | POA: Diagnosis not present

## 2017-07-13 DIAGNOSIS — F909 Attention-deficit hyperactivity disorder, unspecified type: Secondary | ICD-10-CM

## 2017-07-13 DIAGNOSIS — F331 Major depressive disorder, recurrent, moderate: Secondary | ICD-10-CM

## 2017-07-13 DIAGNOSIS — Z811 Family history of alcohol abuse and dependence: Secondary | ICD-10-CM | POA: Diagnosis not present

## 2017-07-13 DIAGNOSIS — Z818 Family history of other mental and behavioral disorders: Secondary | ICD-10-CM

## 2017-07-13 DIAGNOSIS — Z8659 Personal history of other mental and behavioral disorders: Secondary | ICD-10-CM

## 2017-07-13 DIAGNOSIS — F411 Generalized anxiety disorder: Secondary | ICD-10-CM

## 2017-07-13 MED ORDER — DULOXETINE HCL 60 MG PO CPEP: 60.0000 mg | ORAL_CAPSULE | Freq: Every day | ORAL | 1 refills | Status: DC

## 2017-07-13 MED ORDER — ARIPIPRAZOLE 10 MG PO TABS: 10.0000 mg | ORAL_TABLET | Freq: Every day | ORAL | 1 refills | Status: DC

## 2017-07-13 MED ORDER — BUPROPION HCL ER (SR) 100 MG PO TB12: 100.0000 mg | ORAL_TABLET | Freq: Every day | ORAL | 1 refills | Status: DC

## 2017-07-13 MED ORDER — DULOXETINE HCL 30 MG PO CPEP: 30.0000 mg | ORAL_CAPSULE | Freq: Every day | ORAL | 1 refills | Status: DC

## 2017-07-13 NOTE — Progress Notes (Signed)
[facility]   Patient Identification: Brenda Burton MRN:  [id number] Date of Evaluation:  07/13/2017 Referral Source: primary care Chief Complaint:   Chief Complaint    Follow-up; Other     Visit Diagnosis:    ICD-10-CM   1. Moderate episode of recurrent major depressive disorder (HCC) F33.1   2. GAD (generalized anxiety disorder) F41.1   3. History of ADHD Z86.59   4. Severe episode of recurrent major depressive disorder, without psychotic features (HCC) F33.2     History of Present Illness:  46 years old currently single Caucasian female initially referred by primary care physician for management of depression and anxiety  Patient diagnoses of mood disorder and depression she has been on different medications the past currently single mom with 3 kids . Youngest one has behavioural issue Sleep study came back normal abilify helping depresswion. 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 ( seeing a cardiologist and pulmonologist) Aggravating factors; work, relationships in past Modifying factors; kids again. Mom Severity of depression; 6.5/10 Some twitches at random around lip. Not visible for now    Past Psychiatric History: depression, mood symptoms, anxiety   Previous Psychotropic Medications: Yes   Substance Abuse History in the last 12 months:  No.  Consequences of Substance Abuse: NA  Past Medical History:  Past Medical History:  Diagnosis Date  . ADHD (attention deficit hyperactivity disorder)   . Asthma   . Migraine   . Psoriatic arthritis (HCC)   . Seasonal allergies   . Thyroid disease     Past Surgical History:  Procedure Laterality Date  . APPENDECTOMY    . DILATION AND CURETTAGE OF UTERUS    . GANGLION CYST EXCISION     right wrist  . HERNIA REPAIR    . OVARIAN CYST REMOVAL    . SHOULDER SURGERY     right   . TONSILLECTOMY      Family Psychiatric History: Mom, uncle: depression. Dad : alcoholic  Family History:  Family History  Problem Relation Age of Onset  . Migraines Mother   . GER disease Mother   . Narcolepsy Mother   . Restless legs syndrome Mother     Social History:   Social History   Socioeconomic History  . Marital status: Divorced    Spouse name: None  . Number of children: None  . Years of education: None  . Highest education level: None  Social Needs  . Financial resource strain: None  . Food insecurity - worry: None  . Food insecurity - inability: None  . Transportation needs - medical: None  . Transportation needs - non-medical: None  Occupational History  . None  Tobacco Use  . Smoking status: Never Smoker  . Smokeless tobacco: Never Used  Substance and Sexual Activity  . Alcohol use: No  . Drug use: No  . Sexual activity: Not Currently    Partners: Male    Birth control/protection: Pill  Other Topics Concern  . None  Social History Narrative  . None     Allergies:   Allergies  Allergen Reactions  . Bactrim   . Biaxin [Clarithromycin]   . Clarithromycin   . Dilaudid [Hydromorphone Hcl]   . Hydromorphone Hcl     REACTION: pruritis  . Imitrex [Sumatriptan]   . Nalbuphine     REACTION: pruritis  . Penicillins  REACTION: rash,hives    Metabolic Disorder Labs: No results found for: HGBA1C, MPG No results found for: PROLACTIN No results found for: CHOL, TRIG, HDL, CHOLHDL, VLDL, LDLCALC   Current Medications: Current Outpatient Medications  Medication Sig Dispense Refill  . AIMOVIG 140 DOSE 70 MG/ML SOAJ   3  . albuterol (PROVENTIL HFA;VENTOLIN HFA) 108 (90 BASE) MCG/ACT inhaler Inhale 2 puffs into the lungs every 4 (four) hours as needed for wheezing or shortness of breath. 1 Inhaler 0  . albuterol (PROVENTIL HFA;VENTOLIN HFA) 108 (90 Base) MCG/ACT inhaler Inhale into the lungs.    . ARIPiprazole (ABILIFY) 10 MG tablet  Take 1 tablet (10 mg total) by mouth daily. 30 tablet 1  . aspirin EC 81 MG tablet Take by mouth.    Marland Kitchen. buPROPion (WELLBUTRIN SR) 100 MG 12 hr tablet Take 1 tablet (100 mg total) by mouth daily. 30 tablet 1  . DULoxetine (CYMBALTA) 30 MG capsule Take 1 capsule (30 mg total) by mouth daily. 30 capsule 1  . DULoxetine (CYMBALTA) 60 MG capsule Take 1 capsule (60 mg total) by mouth daily. 30 capsule 1  . etanercept (ENBREL SURECLICK) 50 MG/ML injection INJECT ONE pen SUBCUTANEOUSLY EVERY WEEK    . ipratropium-albuterol (DUONEB) 0.5-2.5 (3) MG/3ML SOLN 3 mLs.    Marland Kitchen. levothyroxine (SYNTHROID, LEVOTHROID) 25 MCG tablet Take by mouth.    . loratadine (CLARITIN) 10 MG tablet Take 10 mg by mouth daily.    Marland Kitchen. losartan (COZAAR) 25 MG tablet TAKE ONE TABLET BY MOUTH ONCE DAILY    . methocarbamol (ROBAXIN) 750 MG tablet Take 750 mg by mouth 3 (three) times daily.    . montelukast (SINGULAIR) 10 MG tablet Take 10 mg by mouth at bedtime.    . norethindrone-ethinyl estradiol (JUNEL FE,GILDESS FE,LOESTRIN FE) 1-20 MG-MCG tablet Take 1 tablet by mouth daily.    . meloxicam (MOBIC) 15 MG tablet One tab PO qAM with breakfast for 2 weeks, then daily prn pain. (Patient not taking: Reported on 07/13/2017) 30 tablet 3  . methocarbamol (ROBAXIN) 750 MG tablet Take 750 mg by mouth.    . mometasone-formoterol (DULERA) 200-5 MCG/ACT AERO 2 puffs 2 (two) times daily.     . montelukast (SINGULAIR) 10 MG tablet Take 10 mg by mouth Nightly.    . Prenatal Vit-Fe Fum-FA-Omega (PRENATAL MULTI +DHA) 27-0.8-228 MG CAPS Take 1 capsule by mouth daily.     No current facility-administered medications for this visit.       Psychiatric Specialty Exam: Review of Systems  Cardiovascular: Negative for palpitations.  Skin: Negative for rash.  Psychiatric/Behavioral: Negative for substance abuse.    Blood pressure 108/68, pulse 94, height 5' 4.3" (1.633 m), weight 198 lb (89.8 kg), unknown if currently breastfeeding.Body mass index is  33.67 kg/m.  General Appearance: Casual  Eye Contact:  Fair  Speech:  Normal Rate  Volume:  Normal  Mood:  Less subdued  Affect: congruent  Thought Process:  Goal Directed  Orientation:  Full (Time, Place, and Person)  Thought Content:  Rumination  Suicidal Thoughts:  No  Homicidal Thoughts:  No  Memory:  Immediate;   Fair Recent;   Fair  Judgement:  Fair  Insight:  Shallow  Psychomotor Activity:  Decreased  Concentration:  Concentration: Fair and Attention Span: Fair  Recall:  [employer]  Fund of Knowledge:Fair  Language: Fair  Akathisia:  Negative  Handed:  Right  AIMS (if indicated):    Assets:  Desire for Improvement  ADL's:  Intact  Cognition: WNL  Sleep:  fair    Treatment Plan Summary: Medication management and Plan as follows  1. Major depression, recurrent moderate: not worse. wil continue abilify 10mg . Evaluate if twitches gets worse or other causes Continue wellbutrin. Not increasing for now. Remains functional   2. GAd: not worse. Continue cymbalta  3. 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  Increase acitivity  4. ADHD: prior diagnosis.not worse but can get distracted.  SVT in control. May or can work on incraseing wellbutrin next visit  Fu 2months.     Thresa Ross, MD 12/17/20184:16 PM

## 2017-08-12 ENCOUNTER — Ambulatory Visit (HOSPITAL_COMMUNITY): Payer: BLUE CROSS/BLUE SHIELD | Admitting: Licensed Clinical Social Worker

## 2017-08-25 ENCOUNTER — Other Ambulatory Visit (HOSPITAL_COMMUNITY): Payer: Self-pay | Admitting: Psychiatry

## 2017-09-10 ENCOUNTER — Encounter (HOSPITAL_COMMUNITY): Payer: Self-pay | Admitting: Psychiatry

## 2017-09-10 ENCOUNTER — Ambulatory Visit (INDEPENDENT_AMBULATORY_CARE_PROVIDER_SITE_OTHER): Payer: BLUE CROSS/BLUE SHIELD | Admitting: Psychiatry

## 2017-09-10 VITALS — BP 126/84 | HR 94 | Ht 64.3 in | Wt 203.0 lb

## 2017-09-10 DIAGNOSIS — I471 Supraventricular tachycardia: Secondary | ICD-10-CM | POA: Diagnosis not present

## 2017-09-10 DIAGNOSIS — F909 Attention-deficit hyperactivity disorder, unspecified type: Secondary | ICD-10-CM | POA: Diagnosis not present

## 2017-09-10 DIAGNOSIS — F411 Generalized anxiety disorder: Secondary | ICD-10-CM

## 2017-09-10 DIAGNOSIS — Z818 Family history of other mental and behavioral disorders: Secondary | ICD-10-CM

## 2017-09-10 DIAGNOSIS — R251 Tremor, unspecified: Secondary | ICD-10-CM | POA: Diagnosis not present

## 2017-09-10 DIAGNOSIS — Z72821 Inadequate sleep hygiene: Secondary | ICD-10-CM

## 2017-09-10 DIAGNOSIS — I493 Ventricular premature depolarization: Secondary | ICD-10-CM

## 2017-09-10 DIAGNOSIS — Z638 Other specified problems related to primary support group: Secondary | ICD-10-CM

## 2017-09-10 DIAGNOSIS — Z79899 Other long term (current) drug therapy: Secondary | ICD-10-CM | POA: Diagnosis not present

## 2017-09-10 DIAGNOSIS — R253 Fasciculation: Secondary | ICD-10-CM

## 2017-09-10 DIAGNOSIS — Z811 Family history of alcohol abuse and dependence: Secondary | ICD-10-CM

## 2017-09-10 DIAGNOSIS — Z8659 Personal history of other mental and behavioral disorders: Secondary | ICD-10-CM

## 2017-09-10 DIAGNOSIS — F331 Major depressive disorder, recurrent, moderate: Secondary | ICD-10-CM

## 2017-09-10 DIAGNOSIS — F332 Major depressive disorder, recurrent severe without psychotic features: Secondary | ICD-10-CM

## 2017-09-10 DIAGNOSIS — M1990 Unspecified osteoarthritis, unspecified site: Secondary | ICD-10-CM

## 2017-09-10 MED ORDER — CLONAZEPAM 0.5 MG PO TABS: 0.5000 mg | ORAL_TABLET | Freq: Every day | ORAL | 0 refills | Status: DC | PRN

## 2017-09-10 NOTE — Progress Notes (Signed)
[facility] Outpatient Follow up visit   Patient Identification: Brenda Burton MRN:  [id number] Date of Evaluation:  09/10/2017 Referral Source: primary care Chief Complaint:   Chief Complaint    Follow-up; Other     Visit Diagnosis:    ICD-10-CM   1. GAD (generalized anxiety disorder) F41.1   2. Moderate episode of recurrent major depressive disorder (HCC) F33.1   3. History of ADHD Z86.59   4. Severe episode of recurrent major depressive disorder, without psychotic features (HCC) F33.2     History of Present Illness:  47 years old currently single Caucasian female initially referred by primary care physician for management of depression and anxiety  Patient diagnoses of mood disorder and depression she has been on different medications the past currently single mom with 3 kids . Youngest one has behavioural issue She feels abilify making her numb or slow  feeling anxious. 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 ( seeing a cardiologist and pulmonologist) Aggravating factors; work  relationships in past Modifying factors;kids  Mom Severity of depression; 6/10  Some twitches at random around lip. Says feels twitches are somewhat worse or and have hand tremors     Past Psychiatric History: depression, mood symptoms, anxiety   Previous Psychotropic Medications: Yes   Substance Abuse History in the last 12 months:  No.  Consequences of Substance Abuse: NA  Past Medical History:  Past Medical History:  Diagnosis Date  . ADHD (attention deficit hyperactivity disorder)   . Asthma   . Migraine   . Psoriatic arthritis (HCC)   . Seasonal allergies   . Thyroid disease     Past Surgical History:  Procedure Laterality Date  . APPENDECTOMY    . DILATION AND CURETTAGE OF UTERUS    . GANGLION CYST EXCISION     right wrist  . HERNIA REPAIR     . OVARIAN CYST REMOVAL    . SHOULDER SURGERY     right   . TONSILLECTOMY      Family Psychiatric History: Mom, uncle: depression. Dad : alcoholic  Family History:  Family History  Problem Relation Age of Onset  . Migraines Mother   . GER disease Mother   . Narcolepsy Mother   . Restless legs syndrome Mother     Social History:   Social History   Socioeconomic History  . Marital status: Divorced    Spouse name: None  . Number of children: None  . Years of education: None  . Highest education level: None  Social Needs  . Financial resource strain: None  . Food insecurity - worry: None  . Food insecurity - inability: None  . Transportation needs - medical: None  . Transportation needs - non-medical: None  Occupational History  . None  Tobacco Use  . Smoking status: Never Smoker  . Smokeless tobacco: Never Used  Substance and Sexual Activity  . Alcohol use: No  . Drug use: No  . Sexual activity: Not Currently    Partners: Male    Birth control/protection: Pill  Other Topics Concern  . None  Social History Narrative  . None     Allergies:   Allergies  Allergen Reactions  . Bactrim   . Biaxin [Clarithromycin]   . Clarithromycin   . Dilaudid [Hydromorphone Hcl]   . Hydromorphone Hcl  REACTION: pruritis  . Imitrex [Sumatriptan]   . Nalbuphine     REACTION: pruritis  . Penicillins     REACTION: rash,hives    Metabolic Disorder Labs: No results found for: HGBA1C, MPG No results found for: PROLACTIN No results found for: CHOL, TRIG, HDL, CHOLHDL, VLDL, LDLCALC   Current Medications: Current Outpatient Medications  Medication Sig Dispense Refill  . AIMOVIG 140 DOSE 70 MG/ML SOAJ   3  . albuterol (PROVENTIL HFA;VENTOLIN HFA) 108 (90 BASE) MCG/ACT inhaler Inhale 2 puffs into the lungs every 4 (four) hours as needed for wheezing or shortness of breath. 1 Inhaler 0  . albuterol (PROVENTIL HFA;VENTOLIN HFA) 108 (90 Base) MCG/ACT inhaler Inhale into the  lungs.    Marland Kitchen. aspirin EC 81 MG tablet Take by mouth.    Marland Kitchen. buPROPion (WELLBUTRIN SR) 100 MG 12 hr tablet Take 1 tablet (100 mg total) by mouth daily. 30 tablet 1  . DULoxetine (CYMBALTA) 30 MG capsule Take 1 capsule (30 mg total) by mouth daily. 30 capsule 1  . DULoxetine (CYMBALTA) 60 MG capsule Take 1 capsule (60 mg total) by mouth daily. 30 capsule 1  . etanercept (ENBREL SURECLICK) 50 MG/ML injection INJECT ONE pen SUBCUTANEOUSLY EVERY WEEK    . ipratropium-albuterol (DUONEB) 0.5-2.5 (3) MG/3ML SOLN 3 mLs.    Marland Kitchen. levothyroxine (SYNTHROID, LEVOTHROID) 25 MCG tablet Take by mouth.    . loratadine (CLARITIN) 10 MG tablet Take 10 mg by mouth daily.    Marland Kitchen. losartan (COZAAR) 25 MG tablet TAKE ONE TABLET BY MOUTH ONCE DAILY    . methocarbamol (ROBAXIN) 750 MG tablet Take 750 mg by mouth 3 (three) times daily.    . methocarbamol (ROBAXIN) 750 MG tablet Take 750 mg by mouth.    . mometasone-formoterol (DULERA) 200-5 MCG/ACT AERO 2 puffs 2 (two) times daily.     . montelukast (SINGULAIR) 10 MG tablet Take 10 mg by mouth at bedtime.    . montelukast (SINGULAIR) 10 MG tablet Take 10 mg by mouth Nightly.    . norethindrone-ethinyl estradiol (JUNEL FE,GILDESS FE,LOESTRIN FE) 1-20 MG-MCG tablet Take 1 tablet by mouth daily.    . Prenatal Vit-Fe Fum-FA-Omega (PRENATAL MULTI +DHA) 27-0.8-228 MG CAPS Take 1 capsule by mouth daily.    . clonazePAM (KLONOPIN) 0.5 MG tablet Take 1 tablet (0.5 mg total) by mouth daily as needed for anxiety. Can take half  At times one tablet for anxiety prn 30 tablet 0  . meloxicam (MOBIC) 15 MG tablet One tab PO qAM with breakfast for 2 weeks, then daily prn pain. (Patient not taking: Reported on 07/13/2017) 30 tablet 3   No current facility-administered medications for this visit.       Psychiatric Specialty Exam: Review of Systems  Cardiovascular: Negative for palpitations.  Skin: Negative for rash.  Psychiatric/Behavioral: Negative for substance abuse. The patient is  nervous/anxious.     Blood pressure 126/84, pulse 94, height 5' 4.3" (1.633 m), weight 203 lb (92.1 kg), unknown if currently breastfeeding.Body mass index is 34.52 kg/m.  General Appearance: Casual  Eye Contact:  Fair  Speech:  Normal Rate  Volume:  Normal  Mood:  Stressed and anxious  Affect: congruent  Thought Process:  Goal Directed  Orientation:  Full (Time, Place, and Person)  Thought Content:  Rumination  Suicidal Thoughts:  No  Homicidal Thoughts:  No  Memory:  Immediate;   Fair Recent;   Fair  Judgement:  Fair  Insight:  Shallow  Psychomotor Activity:  Decreased  Concentration:  Concentration: Fair and Attention Span: Fair  Recall:  [employer] of Knowledge:Fair  Language: Fair  Akathisia:  Negative  Handed:  Right  AIMS (if indicated):    Assets:  Desire for Improvement  ADL's:  Intact  Cognition: WNL  Sleep:  fair    Treatment Plan Summary: Medication management and Plan as follows  1. Major depression, recurrent moderate: exacerbated by home stress. Continue cymbalta. Will taper down abilify due to some concern of tremors  And stop abilify in 4 days Continue wellbutrin. Not increasing for now.  2. GAd: worse. Continue cymbalta. Add small dose of klonopine 0.5mg  half to one at evening  3. 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   4. ADHD: manageable on wellbutrin.. Gets distracted avoiding stimulants due to sVT and anxiety  Fu 36m    Thresa Ross, MD 2/14/20194:28 PM

## 2017-09-28 ENCOUNTER — Other Ambulatory Visit (HOSPITAL_COMMUNITY): Payer: Self-pay | Admitting: Psychiatry

## 2017-09-28 MED ORDER — DULOXETINE HCL 60 MG PO CPEP: 60.0000 mg | ORAL_CAPSULE | Freq: Every day | ORAL | 0 refills | Status: DC

## 2017-09-28 MED ORDER — DULOXETINE HCL 30 MG PO CPEP: 30.0000 mg | ORAL_CAPSULE | Freq: Every day | ORAL | 0 refills | Status: DC

## 2017-09-28 NOTE — Progress Notes (Signed)
cymbalta refills sent

## 2017-10-20 ENCOUNTER — Ambulatory Visit (HOSPITAL_COMMUNITY): Payer: Self-pay | Admitting: Psychiatry

## 2017-10-26 ENCOUNTER — Other Ambulatory Visit (HOSPITAL_COMMUNITY): Payer: Self-pay | Admitting: Psychiatry

## 2017-11-11 ENCOUNTER — Other Ambulatory Visit (HOSPITAL_COMMUNITY): Payer: Self-pay

## 2017-11-11 MED ORDER — DULOXETINE HCL 30 MG PO CPEP: 30.0000 mg | ORAL_CAPSULE | Freq: Every day | ORAL | 0 refills | Status: DC

## 2017-11-16 ENCOUNTER — Encounter: Payer: Self-pay | Admitting: Emergency Medicine

## 2017-11-16 ENCOUNTER — Emergency Department (INDEPENDENT_AMBULATORY_CARE_PROVIDER_SITE_OTHER)
Admission: EM | Admit: 2017-11-16 | Discharge: 2017-11-16 | Disposition: A | Discharge status: Home / Self Care | Payer: BLUE CROSS/BLUE SHIELD | Source: Home / Self Care | Attending: Family Medicine | Admitting: Family Medicine

## 2017-11-16 DIAGNOSIS — S39012A Strain of muscle, fascia and tendon of lower back, initial encounter: Secondary | ICD-10-CM | POA: Diagnosis not present

## 2017-11-16 MED ORDER — KETOROLAC TROMETHAMINE 60 MG/2ML IM SOLN
60.0000 mg | Freq: Once | INTRAMUSCULAR | Status: AC
  Administered 2017-11-16: 60 mg via INTRAMUSCULAR

## 2017-11-16 NOTE — ED Provider Notes (Signed)
[facility]    CSN: [id number] Arrival date & time: 11/16/17  1124     History   Chief Complaint Chief Complaint  Patient presents with  . Back Pain    HPI Brenda Burton is a 47 y.o. female.   Patient awoke two days ago with diffuse bilateral low back pain that does not radiate.   She denies bowel or bladder dysfunction, and no saddle numbness.  The pain is sharp and worse with position.  She recalls no injury, but admits she was lifting an awkward box about 4 days ago.  Ibuprofen helps somewhat.  She reports that she has Robaxin 750mg  on hand at home.  The history is provided by the patient.  Back Pain  Location:  Lumbar spine Quality:  Stabbing Radiates to:  Does not radiate Pain severity:  Moderate Pain is:  Same all the time Onset quality:  Sudden Duration:  2 days Timing:  Constant Progression:  Unchanged Chronicity:  New Context: lifting heavy objects   Relieved by:  Nothing Worsened by:  Movement Ineffective treatments:  None tried Associated symptoms: no abdominal pain, no abdominal swelling, no bladder incontinence, no bowel incontinence, no dysuria, no fever, no leg pain, no numbness, no paresthesias, no pelvic pain, no perianal numbness, no tingling and no weakness   Risk factors: obesity     Past Medical History:  Diagnosis Date  . ADHD (attention deficit hyperactivity disorder)   . Asthma   . Migraine   . Psoriatic arthritis (HCC)   . Seasonal allergies   . Thyroid disease     Patient Active Problem List   Diagnosis Date Noted  . Right foot pain 02/22/2016  . Left knee pain 02/22/2016  . ADHD 10/21/2006  . TMJ PAIN 10/21/2006  . COUGH 08/05/2006  . ALLERGIC RHINITIS, SEASONAL 05/28/2006  . Major depressive disorder, recurrent episode (HCC) 05/05/2006  . MIGRAINE, UNSPEC., W/O INTRACTABLE MIGRAINE 05/05/2006    Past Surgical History:  Procedure Laterality Date  . APPENDECTOMY    . DILATION AND CURETTAGE OF UTERUS    .  GANGLION CYST EXCISION     right wrist  . HERNIA REPAIR    . OVARIAN CYST REMOVAL    . SHOULDER SURGERY     right   . TONSILLECTOMY      OB History    Gravida  1   Para      Term      Preterm      AB      Living        SAB      TAB      Ectopic      Multiple      Live Births               Home Medications    Prior to Admission medications   Medication Sig Start Date End Date Taking? Authorizing Provider  AIMOVIG 140 DOSE 70 MG/ML SOAJ  06/04/17   [provider]  albuterol (PROVENTIL HFA;VENTOLIN HFA) 108 (90 BASE) MCG/ACT inhaler Inhale 2 puffs into the lungs every 4 (four) hours as needed for wheezing or shortness of breath. 10/21/14   Lattie HawBeese, Stephen A, MD  albuterol (PROVENTIL HFA;VENTOLIN HFA) 108 (90 Base) MCG/ACT inhaler Inhale into the lungs. 10/21/14   [provider]  aspirin EC 81 MG tablet Take by mouth.    [provider]  buPROPion (WELLBUTRIN SR) 100 MG 12 hr tablet TAKE 1 TABLET BY MOUTH ONCE  DAILY 10/06/17   Thresa Ross, MD  clonazePAM (KLONOPIN) 0.5 MG tablet Take 1 tablet (0.5 mg total) by mouth daily as needed for anxiety. Can take half  At times one tablet for anxiety prn 09/10/17   Thresa Ross, MD  DULoxetine (CYMBALTA) 30 MG capsule Take 1 capsule (30 mg total) by mouth daily. 11/11/17   Thresa Ross, MD  DULoxetine (CYMBALTA) 60 MG capsule Take 1 capsule (60 mg total) by mouth daily. 09/28/17   Thresa Ross, MD  etanercept (ENBREL SURECLICK) 50 MG/ML injection INJECT ONE pen SUBCUTANEOUSLY EVERY WEEK 06/16/17   [provider]  ipratropium-albuterol (DUONEB) 0.5-2.5 (3) MG/3ML SOLN 3 mLs. 05/05/14   [provider]  levothyroxine (SYNTHROID, LEVOTHROID) 25 MCG tablet Take by mouth.    [provider]  loratadine (CLARITIN) 10 MG tablet Take 10 mg by mouth daily.    [provider]  losartan (COZAAR) 25 MG tablet TAKE ONE TABLET BY MOUTH ONCE DAILY 12/25/16   [provider]  meloxicam (MOBIC) 15 MG tablet One tab PO qAM with breakfast for 2 weeks, then daily prn pain. Patient not taking: Reported on 07/13/2017 02/22/16   Monica Becton, MD  methocarbamol (ROBAXIN) 750 MG tablet Take 750 mg by mouth 3 (three) times daily.    [provider]  methocarbamol (ROBAXIN) 750 MG tablet Take 750 mg by mouth.    [provider]  mometasone-formoterol (DULERA) 200-5 MCG/ACT AERO 2 puffs 2 (two) times daily.  07/10/16   [provider]  montelukast (SINGULAIR) 10 MG tablet Take 10 mg by mouth at bedtime.    [provider]  montelukast (SINGULAIR) 10 MG tablet Take 10 mg by mouth Nightly.    [provider]  norethindrone-ethinyl estradiol (JUNEL FE,GILDESS FE,LOESTRIN FE) 1-20 MG-MCG tablet Take 1 tablet by mouth daily.    [provider]  Prenatal Vit-Fe Fum-FA-Omega (PRENATAL MULTI +DHA) 27-0.8-228 MG CAPS Take 1 capsule by mouth daily.    [provider]  ARIPiprazole (ABILIFY) 10 MG tablet Take 1 tablet (10 mg total) by mouth daily. 07/13/17 09/10/17  Thresa Ross, MD    Family History Family History  Problem Relation Age of Onset  . Migraines Mother   . GER disease Mother   . Narcolepsy Mother   . Restless legs syndrome Mother     Social History Social History   Tobacco Use  . Smoking status: Never Smoker  . Smokeless tobacco: Never Used  Substance Use Topics  . Alcohol use: No  . Drug use: No     Allergies   Bactrim; Biaxin [clarithromycin]; Clarithromycin; Dilaudid [hydromorphone hcl]; Hydromorphone hcl; Imitrex [sumatriptan]; Nalbuphine; and Penicillins   Review of Systems Review of Systems  Constitutional: Negative for fever.  Gastrointestinal: Negative for abdominal pain and bowel incontinence.  Genitourinary: Negative for bladder incontinence, dysuria and pelvic pain.  Musculoskeletal: Positive for back pain.  Neurological: Negative for tingling, weakness,  numbness and paresthesias.  All other systems reviewed and are negative.    Physical Exam Triage Vital Signs ED Triage Vitals  Enc Vitals Group     BP 11/16/17 1217 (!) 142/96     Pulse Rate 11/16/17 1217 (!) 104     Resp --      Temp 11/16/17 1217 98.7 F (37.1 C)     Temp Source 11/16/17 1217 Oral     SpO2 11/16/17 1217 98 %     Weight 11/16/17 1219 205 lb (93 kg)     Height --  Head Circumference --      Peak Flow --      Pain Score 11/16/17 1219 5     Pain Loc --      Pain Edu? --      Excl. in GC? --    No data found.  Updated Vital Signs BP (!) 142/96 (BP Location: Right Arm)   Pulse (!) 104   Temp 98.7 F (37.1 C) (Oral)   Wt 205 lb (93 kg)   LMP 11/12/2017 (Approximate)   SpO2 98%   Breastfeeding? No   BMI 34.86 kg/m   Visual Acuity Right Eye Distance:   Left Eye Distance:   Bilateral Distance:    Right Eye Near:   Left Eye Near:    Bilateral Near:     Physical Exam  Constitutional: She appears well-developed and well-nourished. No distress.  HENT:  Head: Normocephalic.  Eyes: Pupils are equal, round, and reactive to light.  Neck: Normal range of motion.  Cardiovascular: Normal heart sounds.  Rate 104  Pulmonary/Chest: Breath sounds normal.  Abdominal: There is no tenderness.  Musculoskeletal: She exhibits no edema.       Lumbar back: She exhibits decreased range of motion and tenderness.       Back:  Back:  Range of motion relatively well preserved.  Can heel/toe walk and squat without difficulty.   Tenderness in the midline and bilateral paraspinous muscles from L3 to Sacral area.  Straight leg raising test is negative.  Sitting knee extension test is negative.  Strength and sensation in the lower extremities is normal.  Patellar and achilles reflexes are normal   Neurological: She is alert.  Skin: Skin is warm and dry. No rash noted.  Nursing note and vitals reviewed.    UC Treatments / Results  Labs (all labs ordered are listed,  but only abnormal results are displayed) Labs Reviewed - No data to display  EKG None Radiology No results found.  Procedures Procedures (including critical care time)  Medications Ordered in UC Medications  ketorolac (TORADOL) injection 60 mg (has no administration in time range)     Initial Impression / Assessment and Plan / UC Course  I have reviewed the triage vital signs and the nursing notes.  Pertinent labs & imaging results that were available during my care of the patient were reviewed by me and considered in my medical decision making (see chart for details).    Administered Toradol 60mg  IM  Apply ice pack for 20 to 30 minutes, 3 to 4 times daily  Continue until pain and swelling decrease.  Continue Ibuprofen 200mg , 4 tabs every 8 hours with food.  May continue Robaxin 750mg .  Begin back range of motion and stretching exercises as tolerated.  Avoid heavy lifting. Followup with Dr. Rodney Langton or Dr. Clementeen [facility] ([facility]) if not improving about two weeks.     Final Clinical Impressions(s) / UC Diagnoses   Final diagnoses:  Strain of lumbar region, initial encounter    ED Discharge Orders    None           Lattie Haw, MD 11/16/17 1317

## 2017-11-16 NOTE — ED Triage Notes (Signed)
Pt c/o low back pain x 2 days. Denies injury 

## 2017-11-16 NOTE — Discharge Instructions (Addendum)
Apply ice pack for 20 to 30 minutes, 3 to 4 times daily  Continue until pain and swelling decrease.  Continue Ibuprofen 200mg , 4 tabs every 8 hours with food.  May continue Robaxin 750mg .  Begin back range of motion and stretching exercises as tolerated.  Avoid heavy lifting

## 2017-11-23 ENCOUNTER — Ambulatory Visit (INDEPENDENT_AMBULATORY_CARE_PROVIDER_SITE_OTHER): Payer: BLUE CROSS/BLUE SHIELD | Admitting: Psychiatry

## 2017-11-23 ENCOUNTER — Encounter (HOSPITAL_COMMUNITY): Payer: Self-pay | Admitting: Psychiatry

## 2017-11-23 VITALS — BP 120/74 | HR 100 | Ht 65.0 in | Wt 206.4 lb

## 2017-11-23 DIAGNOSIS — Z811 Family history of alcohol abuse and dependence: Secondary | ICD-10-CM | POA: Diagnosis not present

## 2017-11-23 DIAGNOSIS — Z8659 Personal history of other mental and behavioral disorders: Secondary | ICD-10-CM

## 2017-11-23 DIAGNOSIS — F909 Attention-deficit hyperactivity disorder, unspecified type: Secondary | ICD-10-CM | POA: Diagnosis not present

## 2017-11-23 DIAGNOSIS — F411 Generalized anxiety disorder: Secondary | ICD-10-CM

## 2017-11-23 DIAGNOSIS — Z818 Family history of other mental and behavioral disorders: Secondary | ICD-10-CM

## 2017-11-23 DIAGNOSIS — F331 Major depressive disorder, recurrent, moderate: Secondary | ICD-10-CM | POA: Diagnosis not present

## 2017-11-23 MED ORDER — DULOXETINE HCL 30 MG PO CPEP: 30.0000 mg | ORAL_CAPSULE | Freq: Every day | ORAL | 0 refills | Status: DC

## 2017-11-23 MED ORDER — BUPROPION HCL ER (SR) 100 MG PO TB12: 100.0000 mg | ORAL_TABLET | Freq: Every day | ORAL | 1 refills | Status: DC

## 2017-11-23 MED ORDER — DULOXETINE HCL 60 MG PO CPEP: 60.0000 mg | ORAL_CAPSULE | Freq: Every day | ORAL | 0 refills | Status: DC

## 2017-11-23 MED ORDER — BUSPIRONE HCL 7.5 MG PO TABS: 7.5000 mg | ORAL_TABLET | Freq: Every day | ORAL | 0 refills | Status: DC

## 2017-11-23 NOTE — Progress Notes (Signed)
[facility] Outpatient Follow up visit   Patient Identification: Brenda Burton MRN:  [id number] Date of Evaluation:  11/23/2017 Referral Source: primary care Chief Complaint:   Chief Complaint    Follow-up; Medication Refill     Visit Diagnosis:    ICD-10-CM   1. Moderate episode of recurrent major depressive disorder (HCC) F33.1   2. GAD (generalized anxiety disorder) F41.1   3. History of ADHD Z86.59     History of Present Illness:  47 years old currently single Caucasian female initially referred by primary care physician for management of depression and anxiety  Patient diagnoses of mood disorder and depression she has been on different medications the past currently single mom with 3 kids . Youngest one has behavioural issue  Stopped abilidy due weight concerns. 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 ( seeing a cardiologist and pulmonologist) Aggravating factors; work   relationships in past Modifying factors; kids   Mom Severity of depression; 6.5/10  Twitches are better and not worse since off abilify   Past Psychiatric History: depression, mood symptoms, anxiety   Previous Psychotropic Medications: Yes   Substance Abuse History in the last 12 months:  No.  Consequences of Substance Abuse: NA  Past Medical History:  Past Medical History:  Diagnosis Date  . ADHD (attention deficit hyperactivity disorder)   . Asthma   . Migraine   . Psoriatic arthritis (HCC)   . Seasonal allergies   . Thyroid disease     Past Surgical History:  Procedure Laterality Date  . APPENDECTOMY    . DILATION AND CURETTAGE OF UTERUS    . GANGLION CYST EXCISION     right wrist  . HERNIA REPAIR    . OVARIAN CYST REMOVAL    . SHOULDER  SURGERY     right   . TONSILLECTOMY      Family Psychiatric History: Mom, uncle: depression. Dad : alcoholic  Family History:  Family History  Problem Relation Age of Onset  . Migraines Mother   . GER disease Mother   . Narcolepsy Mother   . Restless legs syndrome Mother     Social History:   Social History   Socioeconomic History  . Marital status: Divorced    Spouse name: Not on file  . Number of children: Not on file  . Years of education: Not on file  . Highest education level: Not on file  Occupational History  . Not on file  Social Needs  . Financial resource strain: Not on file  . Food insecurity:    Worry: Not on file    Inability: Not on file  . Transportation needs:    Medical: Not on file    Non-medical: Not on file  Tobacco Use  . Smoking status: Never Smoker  . Smokeless tobacco: Never Used  Substance and Sexual Activity  . Alcohol use: No  . Drug use: No  . Sexual activity: Not Currently    Partners: Male    Birth control/protection: Pill  Lifestyle  . Physical activity:    Days per week: Not on file    Minutes per session: Not on file  . Stress: Not on file  Relationships  . Social connections:  Talks on phone: Not on file    Gets together: Not on file    Attends religious service: Not on file    Active member of club or organization: Not on file    Attends meetings of clubs or organizations: Not on file    Relationship status: Not on file  Other Topics Concern  . Not on file  Social History Narrative  . Not on file     Allergies:   Allergies  Allergen Reactions  . Bactrim   . Biaxin [Clarithromycin]   . Clarithromycin   . Dilaudid [Hydromorphone Hcl]   . Hydromorphone Hcl     REACTION: pruritis  . Imitrex [Sumatriptan]   . Nalbuphine     REACTION: pruritis  . Penicillins     REACTION: rash,hives    Metabolic Disorder Labs: No results found for: HGBA1C, MPG No results found for: PROLACTIN No results found for: CHOL,  TRIG, HDL, CHOLHDL, VLDL, LDLCALC   Current Medications: Current Outpatient Medications  Medication Sig Dispense Refill  . AIMOVIG 140 DOSE 70 MG/ML SOAJ   3  . albuterol (PROVENTIL HFA;VENTOLIN HFA) 108 (90 BASE) MCG/ACT inhaler Inhale 2 puffs into the lungs every 4 (four) hours as needed for wheezing or shortness of breath. 1 Inhaler 0  . albuterol (PROVENTIL HFA;VENTOLIN HFA) 108 (90 Base) MCG/ACT inhaler Inhale into the lungs.    Marland Kitchen aspirin EC 81 MG tablet Take by mouth.    Marland Kitchen buPROPion (WELLBUTRIN SR) 100 MG 12 hr tablet Take 1 tablet (100 mg total) by mouth daily. 30 tablet 1  . DULoxetine (CYMBALTA) 30 MG capsule Take 1 capsule (30 mg total) by mouth daily. 30 capsule 0  . DULoxetine (CYMBALTA) 60 MG capsule Take 1 capsule (60 mg total) by mouth daily. 30 capsule 0  . etanercept (ENBREL SURECLICK) 50 MG/ML injection INJECT ONE pen SUBCUTANEOUSLY EVERY WEEK    . furosemide (LASIX) 20 MG tablet Take 20 mg by mouth.    Marland Kitchen ipratropium-albuterol (DUONEB) 0.5-2.5 (3) MG/3ML SOLN 3 mLs.    Marland Kitchen levothyroxine (SYNTHROID, LEVOTHROID) 25 MCG tablet Take by mouth.    . loratadine (CLARITIN) 10 MG tablet Take 10 mg by mouth daily.    Marland Kitchen losartan (COZAAR) 25 MG tablet TAKE ONE TABLET BY MOUTH ONCE DAILY    . meloxicam (MOBIC) 15 MG tablet One tab PO qAM with breakfast for 2 weeks, then daily prn pain. 30 tablet 3  . methocarbamol (ROBAXIN) 750 MG tablet Take 750 mg by mouth 3 (three) times daily.    . methocarbamol (ROBAXIN) 750 MG tablet Take 750 mg by mouth.    . mometasone-formoterol (DULERA) 200-5 MCG/ACT AERO 2 puffs 2 (two) times daily.     . montelukast (SINGULAIR) 10 MG tablet Take 10 mg by mouth at bedtime.    . montelukast (SINGULAIR) 10 MG tablet Take 10 mg by mouth Nightly.    . norethindrone-ethinyl estradiol (JUNEL FE,GILDESS FE,LOESTRIN FE) 1-20 MG-MCG tablet Take 1 tablet by mouth daily.    . Prenatal Vit-Fe Fum-FA-Omega (PRENATAL MULTI +DHA) 27-0.8-228 MG CAPS Take 1 capsule by mouth  daily.    . busPIRone (BUSPAR) 7.5 MG tablet Take 1 tablet (7.5 mg total) by mouth daily. 30 tablet 0   No current facility-administered medications for this visit.       Psychiatric Specialty Exam: Review of Systems  Cardiovascular: Negative for palpitations.  Skin: Negative for rash.  Psychiatric/Behavioral: Negative for substance abuse.    Blood pressure 120/74, pulse 100, height   (1.651 m), weight 206 lb 6.4 oz (93.6 kg), last menstrual period 11/12/2017, SpO2 98 %.Body mass index is 34.35 kg/m.  General Appearance: Casual  Eye Contact:  Fair  Speech:  Normal Rate  Volume:  Normal  Mood:  Somewhat stressed  Affect: congruent  Thought Process:  Goal Directed  Orientation:  Full (Time, Place, and Person)  Thought Content:  Rumination  Suicidal Thoughts:  No  Homicidal Thoughts:  No  Memory:  Immediate;   Fair Recent;   Fair  Judgement:  Fair  Insight:  Shallow  Psychomotor Activity:  Decreased  Concentration:  Concentration: Fair and Attention Span: Fair  Recall:  [employer] of Knowledge:Fair  Language: Fair  Akathisia:  Negative  Handed:  Right  AIMS (if indicated):    Assets:  Desire for Improvement  ADL's:  Intact  Cognition: WNL  Sleep:  fair    Treatment Plan Summary: Medication management and Plan as follows  1. Major depression, recurrent moderate: home and job stress there. Continue cymbatla and wellbutrin add outdoor activitites  2. GAd: fluctuates. Continue cymbalta. Add buspar qd small dose  3. 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   4. ADHD: manageable on wellbutrin. Will avoid stimulants due to history of SVT Fu 50m or 2    Thresa Ross, MD 4/29/20194:35 PM

## 2017-11-29 ENCOUNTER — Emergency Department (INDEPENDENT_AMBULATORY_CARE_PROVIDER_SITE_OTHER)
Admission: EM | Admit: 2017-11-29 | Discharge: 2017-11-29 | Disposition: A | Discharge status: Home / Self Care | Payer: BLUE CROSS/BLUE SHIELD | Source: Home / Self Care

## 2017-11-29 ENCOUNTER — Encounter: Payer: Self-pay | Admitting: Emergency Medicine

## 2017-11-29 DIAGNOSIS — J069 Acute upper respiratory infection, unspecified: Secondary | ICD-10-CM | POA: Diagnosis not present

## 2017-11-29 DIAGNOSIS — R112 Nausea with vomiting, unspecified: Secondary | ICD-10-CM

## 2017-11-29 DIAGNOSIS — R197 Diarrhea, unspecified: Secondary | ICD-10-CM

## 2017-11-29 MED ORDER — BENZONATATE 100 MG PO CAPS: 100.0000 mg | ORAL_CAPSULE | Freq: Three times a day (TID) | ORAL | 0 refills | Status: DC

## 2017-11-29 MED ORDER — ONDANSETRON HCL 4 MG PO TABS: 4.0000 mg | ORAL_TABLET | Freq: Four times a day (QID) | ORAL | 0 refills | Status: DC

## 2017-11-29 MED ORDER — AZITHROMYCIN 250 MG PO TABS: 250.0000 mg | ORAL_TABLET | Freq: Every day | ORAL | 0 refills | Status: DC

## 2017-11-29 NOTE — ED Provider Notes (Addendum)
[facility]    CSN: [id number] Arrival date & time: 11/29/17  1346     History   Chief Complaint Chief Complaint  Patient presents with  . Cough    HPI Brenda Burton is a 47 y.o. female.   HPI  Brenda Burton is a 47 y.o. female presenting to UC with c/o 1 week of productive cough with associated nausea, vomiting and diarrhea for 2 days.  She is also c/o rhinorrhea and head congestion.  She works in Teacher, music and has been around others who have been sick recently.  She recently started a new blood pressure medication so she has not taken any OTC cough/cold medication because she was unsure what she could take.  She has had a low grade fever, which improves with acetaminophen.  Denies chest pain or SOB but she has had body aches and fatigue. She did get the flu vaccine this year.    Past Medical History:  Diagnosis Date  . ADHD (attention deficit hyperactivity disorder)   . Asthma   . Migraine   . Psoriatic arthritis (HCC)   . Seasonal allergies   . Thyroid disease     Patient Active Problem List   Diagnosis Date Noted  . Right foot pain 02/22/2016  . Left knee pain 02/22/2016  . ADHD 10/21/2006  . TMJ PAIN 10/21/2006  . COUGH 08/05/2006  . ALLERGIC RHINITIS, SEASONAL 05/28/2006  . Major depressive disorder, recurrent episode (HCC) 05/05/2006  . MIGRAINE, UNSPEC., W/O INTRACTABLE MIGRAINE 05/05/2006    Past Surgical History:  Procedure Laterality Date  . APPENDECTOMY    . DILATION AND CURETTAGE OF UTERUS    . GANGLION CYST EXCISION     right wrist  . HERNIA REPAIR    . OVARIAN CYST REMOVAL    . SHOULDER SURGERY     right   . TONSILLECTOMY      OB History    Gravida  1   Para      Term      Preterm      AB      Living        SAB      TAB      Ectopic      Multiple      Live Births               Home Medications    Prior to Admission medications   Medication Sig Start Date End Date Taking? Authorizing Provider    AIMOVIG 140 DOSE 70 MG/ML SOAJ  06/04/17   [provider]  albuterol (PROVENTIL HFA;VENTOLIN HFA) 108 (90 BASE) MCG/ACT inhaler Inhale 2 puffs into the lungs every 4 (four) hours as needed for wheezing or shortness of breath. 10/21/14   Lattie Haw, MD  albuterol (PROVENTIL HFA;VENTOLIN HFA) 108 (90 Base) MCG/ACT inhaler Inhale into the lungs. 10/21/14   [provider]  aspirin EC 81 MG tablet Take by mouth.    [provider]  azithromycin (ZITHROMAX) 250 MG tablet Take 1 tablet (250 mg total) by mouth daily. Take first 2 tablets together, then 1 every day until finished. 11/29/17   Lurene Shadow, PA-C  benzonatate (TESSALON) 100 MG capsule Take 1-2 capsules (100-200 mg total) by mouth every 8 (eight) hours. 11/29/17   Lurene Shadow, PA-C  buPROPion (WELLBUTRIN SR) 100 MG 12 hr tablet Take 1 tablet (100 mg total) by mouth daily. 11/23/17   Thresa Ross, MD  busPIRone (BUSPAR)  7.5 MG tablet Take 1 tablet (7.5 mg total) by mouth daily. 11/23/17 11/23/18  Thresa Ross, MD  DULoxetine (CYMBALTA) 30 MG capsule Take 1 capsule (30 mg total) by mouth daily. 11/23/17   Thresa Ross, MD  DULoxetine (CYMBALTA) 60 MG capsule Take 1 capsule (60 mg total) by mouth daily. 11/23/17   Thresa Ross, MD  etanercept (ENBREL SURECLICK) 50 MG/ML injection INJECT ONE pen SUBCUTANEOUSLY EVERY WEEK 06/16/17   [provider]  furosemide (LASIX) 20 MG tablet Take 20 mg by mouth.    [provider]  ipratropium-albuterol (DUONEB) 0.5-2.5 (3) MG/3ML SOLN 3 mLs. 05/05/14   [provider]  levothyroxine (SYNTHROID, LEVOTHROID) 25 MCG tablet Take by mouth.    [provider]  loratadine (CLARITIN) 10 MG tablet Take 10 mg by mouth daily.    [provider]  losartan (COZAAR) 25 MG tablet TAKE ONE TABLET BY MOUTH ONCE DAILY 12/25/16   [provider]  meloxicam (MOBIC) 15 MG tablet One tab PO qAM with breakfast for 2 weeks, then daily prn pain.  02/22/16   Monica Becton, MD  methocarbamol (ROBAXIN) 750 MG tablet Take 750 mg by mouth 3 (three) times daily.    [provider]  methocarbamol (ROBAXIN) 750 MG tablet Take 750 mg by mouth.    [provider]  mometasone-formoterol (DULERA) 200-5 MCG/ACT AERO 2 puffs 2 (two) times daily.  07/10/16   [provider]  montelukast (SINGULAIR) 10 MG tablet Take 10 mg by mouth at bedtime.    [provider]  montelukast (SINGULAIR) 10 MG tablet Take 10 mg by mouth Nightly.    [provider]  norethindrone-ethinyl estradiol (JUNEL FE,GILDESS FE,LOESTRIN FE) 1-20 MG-MCG tablet Take 1 tablet by mouth daily.    [provider]  ondansetron (ZOFRAN) 4 MG tablet Take 1 tablet (4 mg total) by mouth every 6 (six) hours. 11/29/17   Lurene Shadow, PA-C  Prenatal Vit-Fe Fum-FA-Omega (PRENATAL MULTI +DHA) 27-0.8-228 MG CAPS Take 1 capsule by mouth daily.    [provider]  ARIPiprazole (ABILIFY) 10 MG tablet Take 1 tablet (10 mg total) by mouth daily. 07/13/17 09/10/17  Thresa Ross, MD  clonazePAM (KLONOPIN) 0.5 MG tablet Take 1 tablet (0.5 mg total) by mouth daily as needed for anxiety. Can take half  At times one tablet for anxiety prn 09/10/17 11/23/17  Thresa Ross, MD    Family History Family History  Problem Relation Age of Onset  . Migraines Mother   . GER disease Mother   . Narcolepsy Mother   . Restless legs syndrome Mother     Social History Social History   Tobacco Use  . Smoking status: Never Smoker  . Smokeless tobacco: Never Used  Substance Use Topics  . Alcohol use: No  . Drug use: No     Allergies   Bactrim; Biaxin [clarithromycin]; Clarithromycin; Dilaudid [hydromorphone hcl]; Hydromorphone hcl; Imitrex [sumatriptan]; Nalbuphine; and Penicillins   Review of Systems Review of Systems  Constitutional: Positive for fever. Negative for chills.  HENT: Positive for congestion, rhinorrhea and sinus pressure.  Negative for ear pain, sore throat, trouble swallowing and voice change.   Respiratory: Positive for cough. Negative for shortness of breath.   Cardiovascular: Negative for chest pain and palpitations.  Gastrointestinal: Negative for abdominal pain, diarrhea, nausea and vomiting.  Musculoskeletal: Negative for arthralgias, back pain and myalgias.  Skin: Negative for rash.  Neurological: Positive for headaches. Negative for dizziness and light-headedness.  Physical Exam Triage Vital Signs ED Triage Vitals [11/29/17 1432]  Enc Vitals Group     BP 135/88     Pulse Rate 83     Resp      Temp 99 F (37.2 C)     Temp Source Oral     SpO2 99 %     Weight 205 lb 4 oz (93.1 kg)     Height  (1.651 m)     Head Circumference      Peak Flow      Pain Score 0     Pain Loc      Pain Edu?      Excl. in GC?    No data found.  Updated Vital Signs BP 135/88 (BP Location: Right Arm)   Pulse 83   Temp 99 F (37.2 C) (Oral)   Ht  (1.651 m)   Wt 205 lb 4 oz (93.1 kg)   LMP 11/12/2017 (Approximate)   SpO2 99%   BMI 34.16 kg/m  Physical Exam  Constitutional: She is oriented to person, place, and time. She appears well-developed and well-nourished. No distress.  HENT:  Head: Normocephalic and atraumatic.  Right Ear: Tympanic membrane normal.  Left Ear: Tympanic membrane normal.  Nose: Mucosal edema present. Right sinus exhibits no maxillary sinus tenderness and no frontal sinus tenderness. Left sinus exhibits no maxillary sinus tenderness and no frontal sinus tenderness.  Mouth/Throat: Uvula is midline, oropharynx is clear and moist and mucous membranes are normal.  Eyes: EOM are normal.  Neck: Normal range of motion. Neck supple.  Cardiovascular: Normal rate and regular rhythm.  Pulmonary/Chest: Effort normal and breath sounds normal. No stridor. No respiratory distress. She has no wheezes. She has no rales.  Musculoskeletal: Normal range of motion.  Neurological: She is  alert and oriented to person, place, and time.  Skin: Skin is warm and dry. She is not diaphoretic.  Psychiatric: She has a normal mood and affect. Her behavior is normal.  Nursing note and vitals reviewed.    UC Treatments / Results  Labs (all labs ordered are listed, but only abnormal results are displayed) Labs Reviewed - No data to display  EKG None  Radiology No results found.  Procedures Procedures (including critical care time)  Medications Ordered in UC Medications - No data to display  Initial Impression / Assessment and Plan / UC Course  I have reviewed the triage vital signs and the nursing notes.  Pertinent labs & imaging results that were available during my care of the patient were reviewed by me and considered in my medical decision making (see chart for details).     Will cover for likely underlying bacterial URI given persistent low grade fever, fatigue and worsening cough. F/u with PCP in 1 week if not improving.   Final Clinical Impressions(s) / UC Diagnoses   Final diagnoses:  Upper respiratory tract infection, unspecified type  Nausea vomiting and diarrhea     Discharge Instructions      Please take antibiotics as prescribed and be sure to complete entire course even if you start to feel better to ensure infection does not come back.  You may take  acetaminophen every 4-6 hours or in combination with ibuprofen 400-600mg  every 6-8 hours as needed for pain, inflammation, and fever.  Be sure to drink at least eight 8oz glasses of water to stay well hydrated and get at least 8 hours of sleep at night, preferably more while sick.  Please see additional information in this packet to help with your symptoms.    ED Prescriptions    Medication Sig Dispense Auth. Provider   benzonatate (TESSALON) 100 MG capsule Take 1-2 capsules (100-200 mg total) by mouth every 8 (eight) hours. 21 capsule Doroteo Glassman, Kaisen Ackers O, PA-C   azithromycin (ZITHROMAX) 250  MG tablet Take 1 tablet (250 mg total) by mouth daily. Take first 2 tablets together, then 1 every day until finished. 6 tablet Doroteo Glassman, Lelaina Oatis O, PA-C   ondansetron (ZOFRAN) 4 MG tablet Take 1 tablet (4 mg total) by mouth every 6 (six) hours. 12 tablet Lurene Shadow, PA-C     Controlled Substance Prescriptions [place] Controlled Substance Registry consulted? Not Applicable   Rolla Plate 11/29/17 1536    Lurene Shadow, PA-C 11/29/17 1540

## 2017-11-29 NOTE — Discharge Instructions (Signed)
°  Please take antibiotics as prescribed and be sure to complete entire course even if you start to feel better to ensure infection does not come back.  You may take  acetaminophen every 4-6 hours or in combination with ibuprofen 400-600mg  every 6-8 hours as needed for pain, inflammation, and fever.  Be sure to drink at least eight 8oz glasses of water to stay well hydrated and get at least 8 hours of sleep at night, preferably more while sick.   Please see additional information in this packet to help with your symptoms.

## 2017-11-29 NOTE — ED Triage Notes (Signed)
Patient c/o productive cough, n,v,d since Friday, runny nose, head congestion, no fever

## 2017-12-21 ENCOUNTER — Other Ambulatory Visit (HOSPITAL_COMMUNITY): Payer: Self-pay | Admitting: Psychiatry

## 2018-01-14 ENCOUNTER — Other Ambulatory Visit (HOSPITAL_COMMUNITY): Payer: Self-pay | Admitting: Psychiatry

## 2018-01-21 ENCOUNTER — Other Ambulatory Visit: Payer: Self-pay

## 2018-01-21 ENCOUNTER — Encounter (HOSPITAL_COMMUNITY): Payer: Self-pay | Admitting: Psychiatry

## 2018-01-21 ENCOUNTER — Ambulatory Visit (INDEPENDENT_AMBULATORY_CARE_PROVIDER_SITE_OTHER): Payer: BLUE CROSS/BLUE SHIELD | Admitting: Psychiatry

## 2018-01-21 VITALS — BP 118/80 | HR 98 | Ht 65.0 in | Wt 202.0 lb

## 2018-01-21 DIAGNOSIS — F332 Major depressive disorder, recurrent severe without psychotic features: Secondary | ICD-10-CM

## 2018-01-21 DIAGNOSIS — F331 Major depressive disorder, recurrent, moderate: Secondary | ICD-10-CM | POA: Diagnosis not present

## 2018-01-21 DIAGNOSIS — F909 Attention-deficit hyperactivity disorder, unspecified type: Secondary | ICD-10-CM | POA: Diagnosis not present

## 2018-01-21 DIAGNOSIS — F411 Generalized anxiety disorder: Secondary | ICD-10-CM | POA: Diagnosis not present

## 2018-01-21 DIAGNOSIS — Z8659 Personal history of other mental and behavioral disorders: Secondary | ICD-10-CM | POA: Diagnosis not present

## 2018-01-21 MED ORDER — DULOXETINE HCL 30 MG PO CPEP: 30.0000 mg | ORAL_CAPSULE | Freq: Every day | ORAL | 0 refills | Status: DC

## 2018-01-21 MED ORDER — BUPROPION HCL ER (SR) 150 MG PO TB12: 150.0000 mg | ORAL_TABLET | Freq: Every day | ORAL | 0 refills | Status: DC

## 2018-01-21 MED ORDER — DULOXETINE HCL 60 MG PO CPEP: 60.0000 mg | ORAL_CAPSULE | Freq: Every day | ORAL | 0 refills | Status: DC
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
## 2018-01-25 ENCOUNTER — Other Ambulatory Visit (HOSPITAL_COMMUNITY): Payer: Self-pay | Admitting: Psychiatry

## 2018-02-16 ENCOUNTER — Ambulatory Visit (HOSPITAL_COMMUNITY): Payer: Self-pay | Admitting: Psychiatry

## 2018-02-16 ENCOUNTER — Emergency Department
Admission: EM | Admit: 2018-02-16 | Discharge: 2018-02-16 | Disposition: A | Discharge status: Home / Self Care | Payer: BLUE CROSS/BLUE SHIELD | Source: Home / Self Care | Attending: Family Medicine | Admitting: Family Medicine

## 2018-02-16 ENCOUNTER — Other Ambulatory Visit: Payer: Self-pay

## 2018-02-16 DIAGNOSIS — R197 Diarrhea, unspecified: Secondary | ICD-10-CM

## 2018-02-16 DIAGNOSIS — R109 Unspecified abdominal pain: Secondary | ICD-10-CM | POA: Diagnosis not present

## 2018-02-16 DIAGNOSIS — R110 Nausea: Secondary | ICD-10-CM

## 2018-02-16 HISTORY — DX: Major depressive disorder, single episode, unspecified: F32.9

## 2018-02-16 HISTORY — DX: Essential (primary) hypertension: I10

## 2018-02-16 HISTORY — DX: Depression, unspecified: F32.A

## 2018-02-16 NOTE — ED Triage Notes (Signed)
For several days, pt has had diarrhea and nausea.  Denies vomiting today, but severe nausea.  .Brenda Burton

## 2018-02-16 NOTE — Discharge Instructions (Signed)
°  Please go directly to the emergency department this evening for further treatment and testing.
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
## 2018-02-22 ENCOUNTER — Ambulatory Visit (HOSPITAL_COMMUNITY): Payer: BLUE CROSS/BLUE SHIELD | Admitting: Psychiatry

## 2018-02-23 ENCOUNTER — Ambulatory Visit (HOSPITAL_COMMUNITY): Payer: Self-pay | Admitting: Psychiatry

## 2018-02-28 ENCOUNTER — Other Ambulatory Visit (HOSPITAL_COMMUNITY): Payer: Self-pay | Admitting: Psychiatry

## 2018-03-04 ENCOUNTER — Encounter (HOSPITAL_COMMUNITY): Payer: Self-pay | Admitting: Psychiatry

## 2018-03-04 ENCOUNTER — Ambulatory Visit (INDEPENDENT_AMBULATORY_CARE_PROVIDER_SITE_OTHER): Payer: BLUE CROSS/BLUE SHIELD | Admitting: Psychiatry

## 2018-03-04 DIAGNOSIS — F411 Generalized anxiety disorder: Secondary | ICD-10-CM

## 2018-03-04 DIAGNOSIS — F331 Major depressive disorder, recurrent, moderate: Secondary | ICD-10-CM

## 2018-03-04 DIAGNOSIS — Z8659 Personal history of other mental and behavioral disorders: Secondary | ICD-10-CM | POA: Diagnosis not present

## 2018-03-04 DIAGNOSIS — F332 Major depressive disorder, recurrent severe without psychotic features: Secondary | ICD-10-CM

## 2018-03-04 MED ORDER — DULOXETINE HCL 30 MG PO CPEP: 30.0000 mg | ORAL_CAPSULE | Freq: Every day | ORAL | 1 refills | Status: DC

## 2018-03-04 MED ORDER — BUPROPION HCL ER (SR) 200 MG PO TB12: 200.0000 mg | ORAL_TABLET | Freq: Every day | ORAL | 1 refills | Status: DC

## 2018-03-04 MED ORDER — DULOXETINE HCL 60 MG PO CPEP: 60.0000 mg | ORAL_CAPSULE | Freq: Every day | ORAL | 1 refills | Status: DC
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
## 2018-04-23 ENCOUNTER — Emergency Department (INDEPENDENT_AMBULATORY_CARE_PROVIDER_SITE_OTHER): Payer: BLUE CROSS/BLUE SHIELD

## 2018-04-23 ENCOUNTER — Emergency Department
Admission: EM | Admit: 2018-04-23 | Discharge: 2018-04-23 | Disposition: A | Discharge status: Home / Self Care | Payer: BLUE CROSS/BLUE SHIELD | Source: Home / Self Care | Attending: Family Medicine | Admitting: Family Medicine

## 2018-04-23 ENCOUNTER — Other Ambulatory Visit: Payer: Self-pay

## 2018-04-23 ENCOUNTER — Encounter: Payer: Self-pay | Admitting: Emergency Medicine

## 2018-04-23 DIAGNOSIS — J069 Acute upper respiratory infection, unspecified: Secondary | ICD-10-CM | POA: Diagnosis not present

## 2018-04-23 DIAGNOSIS — R05 Cough: Secondary | ICD-10-CM

## 2018-04-23 DIAGNOSIS — B9789 Other viral agents as the cause of diseases classified elsewhere: Secondary | ICD-10-CM | POA: Diagnosis not present

## 2018-04-23 MED ORDER — PREDNISONE 20 MG PO TABS: ORAL_TABLET | ORAL | 0 refills | Status: DC

## 2018-04-23 MED ORDER — BENZONATATE 200 MG PO CAPS: ORAL_CAPSULE | ORAL | 0 refills | Status: DC

## 2018-04-23 MED ORDER — IPRATROPIUM-ALBUTEROL 0.5-2.5 (3) MG/3ML IN SOLN
3.0000 mL | Freq: Once | RESPIRATORY_TRACT | Status: AC
  Administered 2018-04-23: 3 mL via RESPIRATORY_TRACT

## 2018-04-23 NOTE — ED Provider Notes (Signed)
[facility]    CSN: [id number] Arrival date & time: 04/23/18  1124     History   Chief Complaint Chief Complaint  Patient presents with  . Cough    HPI Brenda Burton is a 47 y.o. female.   Two weeks ago patient developed dry cough, fatigue, sore throat, and myalgias.  She was started on a Z-pak at that time.  About 5 days later she had not improved and was started on doxycycline and a prednisone taper.  During the past several days she has felt worse with development of low grade fever (99.4 this morning), myalgias, chills, and increased cough. She has asthma, and rarely needs her albuterol inhaler when well.  She has a past history of pneumonia.  The history is provided by the patient.    Past Medical History:  Diagnosis Date  . ADHD (attention deficit hyperactivity disorder)   . Asthma   . Depression   . Hypertension   . Migraine   . Psoriatic arthritis (HCC)   . Seasonal allergies   . Thyroid disease     Patient Active Problem List   Diagnosis Date Noted  . Right foot pain 02/22/2016  . Left knee pain 02/22/2016  . ADHD 10/21/2006  . TMJ PAIN 10/21/2006  . COUGH 08/05/2006  . ALLERGIC RHINITIS, SEASONAL 05/28/2006  . Major depressive disorder, recurrent episode (HCC) 05/05/2006  . MIGRAINE, UNSPEC., W/O INTRACTABLE MIGRAINE 05/05/2006    Past Surgical History:  Procedure Laterality Date  . APPENDECTOMY    . CARDIAC SURGERY     cardiac ablations  . DILATION AND CURETTAGE OF UTERUS    . GANGLION CYST EXCISION     right wrist  . HERNIA REPAIR    . OVARIAN CYST REMOVAL    . SHOULDER SURGERY     right   . TONSILLECTOMY      OB History    Gravida  1   Para      Term      Preterm      AB      Living        SAB      TAB      Ectopic      Multiple      Live Births               Home Medications    Prior to Admission medications   Medication Sig Start Date End Date Taking? Authorizing Provider  AIMOVIG 140 DOSE  70 MG/ML SOAJ  06/04/17   [provider]  albuterol (PROVENTIL HFA;VENTOLIN HFA) 108 (90 BASE) MCG/ACT inhaler Inhale 2 puffs into the lungs every 4 (four) hours as needed for wheezing or shortness of breath. 10/21/14   Lattie Haw, MD  albuterol (PROVENTIL HFA;VENTOLIN HFA) 108 (90 Base) MCG/ACT inhaler Inhale into the lungs. 10/21/14   [provider]  aspirin EC 81 MG tablet Take by mouth.    [provider]  benzonatate (TESSALON) 200 MG capsule Take one cap by mouth at bedtime as needed for cough.  May repeat in 4 to 6 hours 04/23/18   Lattie Haw, MD  buPROPion [facility] SR) 200 MG 12 hr tablet Take 1 tablet (200 mg total) by mouth daily. 03/04/18   Thresa Ross, MD  DULoxetine (CYMBALTA) 30 MG capsule Take 1 capsule (30 mg total) by mouth daily. 03/04/18   Thresa Ross, MD  DULoxetine (CYMBALTA) 60 MG capsule Take 1 capsule (60 mg total) by  mouth daily. 03/04/18   Thresa Ross, MD  etanercept (ENBREL SURECLICK) 50 MG/ML injection INJECT ONE pen SUBCUTANEOUSLY EVERY WEEK 06/16/17   [provider]  furosemide (LASIX) 20 MG tablet Take 20 mg by mouth.    [provider]  levothyroxine (SYNTHROID, LEVOTHROID) 25 MCG tablet Take by mouth.    [provider]  loratadine (CLARITIN) 10 MG tablet Take 10 mg by mouth daily.    [provider]  losartan (COZAAR) 25 MG tablet TAKE ONE TABLET BY MOUTH ONCE DAILY 12/25/16   [provider]  meloxicam (MOBIC) 15 MG tablet One tab PO qAM with breakfast for 2 weeks, then daily prn pain. 02/22/16   Monica Becton, MD  mometasone-formoterol (DULERA) 200-5 MCG/ACT AERO 2 puffs 2 (two) times daily.  07/10/16   [provider]  montelukast (SINGULAIR) 10 MG tablet Take 10 mg by mouth at bedtime.    [provider]  montelukast (SINGULAIR) 10 MG tablet Take 10 mg by mouth Nightly.    [provider]  norethindrone-ethinyl estradiol (JUNEL FE,GILDESS  FE,LOESTRIN FE) 1-20 MG-MCG tablet Take 1 tablet by mouth daily.    [provider]  ondansetron (ZOFRAN) 4 MG tablet Take 1 tablet (4 mg total) by mouth every 6 (six) hours. 11/29/17   Lurene Shadow, PA-C  predniSONE (DELTASONE) 20 MG tablet Take one tab by mouth twice daily for 5 days, then one daily. Take with food. 04/23/18   Lattie Haw, MD  Prenatal Vit-Fe Fum-FA-Omega (PRENATAL MULTI +DHA) 27-0.8-228 MG CAPS Take 1 capsule by mouth daily.    [provider]  ARIPiprazole (ABILIFY) 10 MG tablet Take 1 tablet (10 mg total) by mouth daily. 07/13/17 09/10/17  Thresa Ross, MD  clonazePAM (KLONOPIN) 0.5 MG tablet Take 1 tablet (0.5 mg total) by mouth daily as needed for anxiety. Can take half  At times one tablet for anxiety prn 09/10/17 11/23/17  Thresa Ross, MD    Family History Family History  Problem Relation Age of Onset  . Migraines Mother   . GER disease Mother   . Narcolepsy Mother   . Restless legs syndrome Mother     Social History Social History   Tobacco Use  . Smoking status: Never Smoker  . Smokeless tobacco: Never Used  Substance Use Topics  . Alcohol use: No  . Drug use: No     Allergies   Bactrim; Biaxin [clarithromycin]; Clarithromycin; Dilaudid [hydromorphone hcl]; Hydromorphone hcl; Imitrex [sumatriptan]; Nalbuphine; and Penicillins   Review of Systems Review of Systems + sore throat + cough No pleuritic pain + wheezing + nasal congestion + post-nasal drainage + sinus pain/pressure No itchy/red eyes ? earache No hemoptysis + SOB + fever, + chills No nausea No vomiting No abdominal pain No diarrhea No urinary symptoms No skin rash + fatigue + myalgias + headache Used OTC meds without relief   Physical Exam Triage Vital Signs ED Triage Vitals  Enc Vitals Group     BP 04/23/18 1238 136/90     Pulse Rate 04/23/18 1238 (!) 108     Resp --      Temp 04/23/18 1238 98.5 F (36.9 C)     Temp Source 04/23/18 1238  Oral     SpO2 04/23/18 1238 99 %     Weight 04/23/18 1239 192 lb (87.1 kg)     Height 04/23/18 1239 5\' 5"  (1.651 m)     Head Circumference --      Peak Flow --  Pain Score 04/23/18 1239 3     Pain Loc --      Pain Edu? --      Excl. in GC? --    No data found.  Updated Vital Signs BP 136/90 (BP Location: Right Arm)   Pulse (!) 108   Temp 98.5 F (36.9 C) (Oral)   Ht 5\' 5"  (1.651 m)   Wt 87.1 kg   LMP 04/02/2018 (Exact Date)   SpO2 99%   BMI 31.95 kg/m   Visual Acuity Right Eye Distance:   Left Eye Distance:   Bilateral Distance:    Right Eye Near:   Left Eye Near:    Bilateral Near:     Physical Exam Nursing notes and Vital Signs reviewed. Appearance:  Patient appears stated age, and in no acute distress Eyes:  Pupils are equal, round, and reactive to light and accomodation.  Extraocular movement is intact.  Conjunctivae are not inflamed  Ears:  Canals normal.  Tympanic membranes normal.  Nose:  Congested turbinates.  Maxillary sinus tenderness is present.  Pharynx:  Normal Neck:  Supple.  Enlarged posterior/lateral nodes are palpated bilaterally, tender to palpation on the left.   Lungs:   Scattered faint expiratory wheezes.  Breath sounds are equal.  Moving air well. Heart:  Regular rate and rhythm without murmurs, rubs, or gallops.  Abdomen:  Nontender without masses or hepatosplenomegaly.  Bowel sounds are present.  No CVA or flank tenderness.  Extremities:  No edema.  Skin:  No rash present.    UC Treatments / Results  Labs (all labs ordered are listed, but only abnormal results are displayed) Labs Reviewed  POCT CBC W AUTO DIFF ([facility]):  WBC 11.4; LY 30.6; MO 7.5; GR 61.9; Hgb 13.1; Platelets 369     EKG None  Radiology Dg Chest 2 View  Result Date: 04/23/2018 CLINICAL DATA:  Cough x2 weeks EXAM: CHEST - 2 VIEW COMPARISON:  Report from 06/12/2010 FINDINGS: The heart size and mediastinal contours are within normal limits. Both lungs  are clear. Resected appearance of the distal right clavicle. Acute osseous abnormality identified. IMPRESSION: No active cardiopulmonary disease. Electronically Signed   By: Tollie Eth M.D.   On: 04/23/2018 13:37    Procedures Procedures (including critical care time)  Medications Ordered in UC Medications  ipratropium-albuterol (DUONEB) 0.5-2.5 (3) MG/3ML nebulizer solution 3 mL (has no administration in time range)    Initial Impression / Assessment and Plan / UC Course  I have reviewed the triage vital signs and the nursing notes.  Pertinent labs & imaging results that were available during my care of the patient were reviewed by me and considered in my medical decision making (see chart for details).    Negative chest X-ray reassuring. Review of [facility] chart records reveals that her WBC has been consistently elevated (13.1 on 02/16/18, and 13.7 on 05/04/17).  Her present leukocytosis (WBC 11.4) is therefore less significant. There is no evidence of bacterial infection today.  Suspect new onset viral URI. Begin prednisone burst/taper. Prescription written for Benzonatate [facility]) to take at bedtime for night-time cough.  Followup with Family Doctor if not improved in one week.    Final Clinical Impressions(s) / UC Diagnoses   Final diagnoses:  Viral URI with cough     Discharge Instructions     Take plain guaifenesin (1200mg  extended release tabs such as Mucinex) twice daily, with plenty of water, for cough and congestion. Get adequate rest.   May use  Afrin nasal spray (or generic oxymetazoline) each morning for about 5 days and then discontinue.  Also recommend using saline nasal spray several times daily and saline nasal irrigation (AYR is a common brand).  Use Flonase nasal spray each morning after using Afrin nasal spray and saline nasal irrigation. Try warm salt water gargles for sore throat.  Stop all antihistamines for now, and other non-prescription cough/cold  preparations. May take Delsym Cough Suppressant with Tessalon at bedtime for nighttime cough.  Continue Breo daily. Continue DuoNeb by nebulizer twice daily.    ED Prescriptions    Medication Sig Dispense Auth. Provider   predniSONE (DELTASONE) 20 MG tablet Take one tab by mouth twice daily for 5 days, then one daily. Take with food. 14 tablet Lattie Haw, MD   benzonatate (TESSALON) 200 MG capsule Take one cap by mouth at bedtime as needed for cough.  May repeat in 4 to 6 hours 15 capsule Cathren Harsh Tera Mater, MD        Lattie Haw, MD 04/26/18 2200

## 2018-04-23 NOTE — ED Triage Notes (Signed)
Dry cough x 2 weeks, worse x 2 days, took z-pak and doxy, not better, SOB, chest burns, ears hurt, face hurts, nausea.

## 2018-04-23 NOTE — Discharge Instructions (Addendum)
Take plain guaifenesin (1200mg  extended release tabs such as Mucinex) twice daily, with plenty of water, for cough and congestion. Get adequate rest.   May use Afrin nasal spray (or generic oxymetazoline) each morning for about 5 days and then discontinue.  Also recommend using saline nasal spray several times daily and saline nasal irrigation (AYR is a common brand).  Use Flonase nasal spray each morning after using Afrin nasal spray and saline nasal irrigation. Try warm salt water gargles for sore throat.  Stop all antihistamines for now, and other non-prescription cough/cold preparations. May take Delsym Cough Suppressant with Tessalon at bedtime for nighttime cough.  Continue Breo daily. Continue DuoNeb by nebulizer twice daily.

## 2018-04-29 LAB — POCT CBC W AUTO DIFF (K'VILLE URGENT CARE)

## 2018-05-03 ENCOUNTER — Other Ambulatory Visit (HOSPITAL_COMMUNITY): Payer: Self-pay | Admitting: Psychiatry

## 2018-05-06 ENCOUNTER — Ambulatory Visit (HOSPITAL_COMMUNITY): Payer: Self-pay | Admitting: Psychiatry

## 2018-05-07 DIAGNOSIS — K219 Gastro-esophageal reflux disease without esophagitis: Secondary | ICD-10-CM | POA: Insufficient documentation

## 2018-05-07 DIAGNOSIS — J309 Allergic rhinitis, unspecified: Secondary | ICD-10-CM | POA: Insufficient documentation

## 2018-05-07 DIAGNOSIS — J45901 Unspecified asthma with (acute) exacerbation: Secondary | ICD-10-CM | POA: Insufficient documentation

## 2018-05-20 ENCOUNTER — Ambulatory Visit (HOSPITAL_COMMUNITY): Payer: BLUE CROSS/BLUE SHIELD | Admitting: Psychiatry

## 2018-05-31 ENCOUNTER — Ambulatory Visit (INDEPENDENT_AMBULATORY_CARE_PROVIDER_SITE_OTHER): Payer: BLUE CROSS/BLUE SHIELD | Admitting: Psychiatry

## 2018-05-31 ENCOUNTER — Other Ambulatory Visit: Payer: Self-pay

## 2018-05-31 ENCOUNTER — Encounter (HOSPITAL_COMMUNITY): Payer: Self-pay | Admitting: Psychiatry

## 2018-05-31 VITALS — BP 112/78 | HR 105 | Ht 65.0 in | Wt 197.0 lb

## 2018-05-31 DIAGNOSIS — Z8659 Personal history of other mental and behavioral disorders: Secondary | ICD-10-CM

## 2018-05-31 DIAGNOSIS — F411 Generalized anxiety disorder: Secondary | ICD-10-CM | POA: Diagnosis not present

## 2018-05-31 DIAGNOSIS — F331 Major depressive disorder, recurrent, moderate: Secondary | ICD-10-CM

## 2018-05-31 MED ORDER — DULOXETINE HCL 60 MG PO CPEP: 60.0000 mg | ORAL_CAPSULE | Freq: Every day | ORAL | 1 refills | Status: DC

## 2018-05-31 MED ORDER — DULOXETINE HCL 30 MG PO CPEP: 30.0000 mg | ORAL_CAPSULE | Freq: Every day | ORAL | 1 refills | Status: DC

## 2018-05-31 MED ORDER — BUPROPION HCL ER (SR) 200 MG PO TB12: 200.0000 mg | ORAL_TABLET | Freq: Every day | ORAL | 2 refills | Status: DC
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
## 2018-06-08 DIAGNOSIS — B370 Candidal stomatitis: Secondary | ICD-10-CM | POA: Insufficient documentation

## 2018-07-26 ENCOUNTER — Other Ambulatory Visit (HOSPITAL_COMMUNITY): Payer: Self-pay | Admitting: Psychiatry

## 2018-08-29 ENCOUNTER — Other Ambulatory Visit (HOSPITAL_COMMUNITY): Payer: Self-pay | Admitting: Psychiatry

## 2018-09-14 ENCOUNTER — Ambulatory Visit (HOSPITAL_COMMUNITY): Payer: BLUE CROSS/BLUE SHIELD | Admitting: Psychiatry

## 2018-09-15 ENCOUNTER — Encounter (HOSPITAL_COMMUNITY): Payer: Self-pay | Admitting: Psychiatry

## 2018-10-03 ENCOUNTER — Other Ambulatory Visit (HOSPITAL_COMMUNITY): Payer: Self-pay | Admitting: Psychiatry

## 2018-10-05 ENCOUNTER — Encounter: Payer: Self-pay | Admitting: Emergency Medicine

## 2018-10-05 ENCOUNTER — Emergency Department
Admission: EM | Admit: 2018-10-05 | Discharge: 2018-10-05 | Disposition: A | Discharge status: Home / Self Care | Payer: BLUE CROSS/BLUE SHIELD | Source: Home / Self Care | Attending: Family Medicine | Admitting: Family Medicine

## 2018-10-05 ENCOUNTER — Emergency Department (INDEPENDENT_AMBULATORY_CARE_PROVIDER_SITE_OTHER): Payer: BLUE CROSS/BLUE SHIELD

## 2018-10-05 ENCOUNTER — Other Ambulatory Visit: Payer: Self-pay

## 2018-10-05 DIAGNOSIS — B9789 Other viral agents as the cause of diseases classified elsewhere: Secondary | ICD-10-CM

## 2018-10-05 DIAGNOSIS — J069 Acute upper respiratory infection, unspecified: Secondary | ICD-10-CM | POA: Diagnosis not present

## 2018-10-05 DIAGNOSIS — J9811 Atelectasis: Secondary | ICD-10-CM | POA: Diagnosis not present

## 2018-10-05 DIAGNOSIS — J9801 Acute bronchospasm: Secondary | ICD-10-CM

## 2018-10-05 LAB — POCT INFLUENZA A/B
Influenza A, POC: NEGATIVE
Influenza B, POC: NEGATIVE

## 2018-10-05 LAB — POCT RAPID STREP A (OFFICE): Rapid Strep A Screen: NEGATIVE

## 2018-10-05 MED ORDER — BENZONATATE 200 MG PO CAPS: ORAL_CAPSULE | ORAL | 0 refills | Status: DC

## 2018-10-05 MED ORDER — PREDNISONE 20 MG PO TABS: ORAL_TABLET | ORAL | 0 refills | Status: DC

## 2018-10-05 MED ORDER — METHYLPREDNISOLONE SODIUM SUCC 125 MG IJ SOLR
80.0000 mg | Freq: Once | INTRAMUSCULAR | Status: AC
  Administered 2018-10-05: 80 mg via INTRAMUSCULAR

## 2018-10-05 MED ORDER — DOXYCYCLINE HYCLATE 100 MG PO CAPS: 100.0000 mg | ORAL_CAPSULE | Freq: Two times a day (BID) | ORAL | 0 refills | Status: DC

## 2018-10-05 MED ORDER — FLUCONAZOLE 150 MG PO TABS: ORAL_TABLET | ORAL | 0 refills | Status: DC

## 2018-10-05 MED ORDER — ONDANSETRON HCL 4 MG PO TABS: 4.0000 mg | ORAL_TABLET | Freq: Three times a day (TID) | ORAL | 0 refills | Status: DC | PRN

## 2018-10-05 NOTE — ED Triage Notes (Signed)
Productive cough, hoarseness, bi-lateral ear pain, SOB,Nausea, sore throat, mouth lesions, low grade temp, headache, body aches,Vomiting on FRI and Sat, sx worse on Sunday

## 2018-10-05 NOTE — ED Provider Notes (Signed)
[facility]    CSN: [id number] Arrival date & time: 10/05/18  1011     History   Chief Complaint Chief Complaint  Patient presents with  . URI    HPI Brenda Burton is a 48 y.o. female.   Five days ago patient developed a headache and fatigue, followed by myalgias, sinus congestion, sore throat, low grade fever and chills/sweats.  Two days ago she developed a cough and anterior chest tightness.  She has had occasional wheezing.  She also had initial nausea/vomiting, with resolution of the vomiting.  The history is provided by the patient.    Past Medical History:  Diagnosis Date  . ADHD (attention deficit hyperactivity disorder)   . Asthma   . Depression   . Hypertension   . Migraine   . Psoriatic arthritis (HCC)   . Seasonal allergies   . Thyroid disease     Patient Active Problem List   Diagnosis Date Noted  . Right foot pain 02/22/2016  . Left knee pain 02/22/2016  . ADHD 10/21/2006  . TMJ PAIN 10/21/2006  . COUGH 08/05/2006  . ALLERGIC RHINITIS, SEASONAL 05/28/2006  . Major depressive disorder, recurrent episode (HCC) 05/05/2006  . MIGRAINE, UNSPEC., W/O INTRACTABLE MIGRAINE 05/05/2006    Past Surgical History:  Procedure Laterality Date  . APPENDECTOMY    . CARDIAC SURGERY     cardiac ablations  . DILATION AND CURETTAGE OF UTERUS    . GANGLION CYST EXCISION     right wrist  . HERNIA REPAIR    . OVARIAN CYST REMOVAL    . SHOULDER SURGERY     right   . TONSILLECTOMY      OB History    Gravida  1   Para      Term      Preterm      AB      Living        SAB      TAB      Ectopic      Multiple      Live Births               Home Medications    Prior to Admission medications   Medication Sig Start Date End Date Taking? Authorizing Provider  albuterol (PROVENTIL HFA;VENTOLIN HFA) 108 (90 BASE) MCG/ACT inhaler Inhale 2 puffs into the lungs every 4 (four) hours as needed for wheezing or shortness of breath.  10/21/14   Lattie Haw, MD  albuterol (PROVENTIL HFA;VENTOLIN HFA) 108 (90 Base) MCG/ACT inhaler Inhale into the lungs. 10/21/14   [provider]  aspirin EC 81 MG tablet Take by mouth.    [provider]  benzonatate (TESSALON) 200 MG capsule Take one cap by mouth at bedtime as needed for cough.  May repeat in 4 to 6 hours 10/05/18   Lattie Haw, MD  buPROPion [facility] SR) 200 MG 12 hr tablet TAKE 1 TABLET BY MOUTH ONCE DAILY 08/30/18   Thresa Ross, MD  doxycycline (VIBRAMYCIN) 100 MG capsule Take 1 capsule (100 mg total) by mouth 2 (two) times daily. Take with food. 10/05/18   Lattie Haw, MD  DULoxetine (CYMBALTA) 30 MG capsule TAKE 1 CAPSULE BY MOUTH ONCE DAILY 08/30/18   Thresa Ross, MD  DULoxetine (CYMBALTA) 60 MG capsule TAKE 1 CAPSULE BY MOUTH ONCE DAILY 08/30/18   Thresa Ross, MD  etanercept (ENBREL SURECLICK) 50 MG/ML injection INJECT ONE pen SUBCUTANEOUSLY EVERY WEEK 06/16/17   [provider]  fluconazole (DIFLUCAN) 150 MG tablet Take one tab by mouth once for yeast infection.  May repeat in 72 hours. 10/05/18   Lattie Haw, MD  fluticasone (FLONASE) 50 MCG/ACT nasal spray Place into the nose. 05/06/18 05/06/19  [provider]  furosemide (LASIX) 20 MG tablet Take 20 mg by mouth.    [provider]  levothyroxine (SYNTHROID, LEVOTHROID) 25 MCG tablet Take by mouth.    [provider]  loratadine (CLARITIN) 10 MG tablet Take 10 mg by mouth daily.    [provider]  losartan (COZAAR) 25 MG tablet TAKE ONE TABLET BY MOUTH ONCE DAILY 12/25/16   [provider]  mometasone-formoterol (DULERA) 200-5 MCG/ACT AERO 2 puffs 2 (two) times daily.  07/10/16   [provider]  montelukast (SINGULAIR) 10 MG tablet Take 10 mg by mouth at bedtime.    [provider]  montelukast (SINGULAIR) 10 MG tablet Take 10 mg by mouth Nightly.    [provider]  norethindrone-ethinyl estradiol  (JUNEL FE,GILDESS FE,LOESTRIN FE) 1-20 MG-MCG tablet Take 1 tablet by mouth daily.    [provider]  ondansetron (ZOFRAN) 4 MG tablet Take 1 tablet (4 mg total) by mouth every 8 (eight) hours as needed for nausea or vomiting. 10/05/18   Lattie Haw, MD  predniSONE (DELTASONE) 20 MG tablet Take one tab by mouth twice daily for 4 days, then one daily for 3 days. Take with food. 10/05/18   Lattie Haw, MD  ARIPiprazole (ABILIFY) 10 MG tablet Take 1 tablet (10 mg total) by mouth daily. 07/13/17 09/10/17  Thresa Ross, MD  clonazePAM (KLONOPIN) 0.5 MG tablet Take 1 tablet (0.5 mg total) by mouth daily as needed for anxiety. Can take half  At times one tablet for anxiety prn 09/10/17 11/23/17  Thresa Ross, MD    Family History Family History  Problem Relation Age of Onset  . Migraines Mother   . GER disease Mother   . Narcolepsy Mother   . Restless legs syndrome Mother     Social History Social History   Tobacco Use  . Smoking status: Never Smoker  . Smokeless tobacco: Never Used  Substance Use Topics  . Alcohol use: No  . Drug use: No     Allergies   Bactrim; Biaxin [clarithromycin]; Clarithromycin; Dilaudid [hydromorphone hcl]; Hydromorphone hcl; Imitrex [sumatriptan]; Nalbuphine; and Penicillins   Review of Systems Review of Systems + sore throat + hoarse + cough No pleuritic pain but feels tight in anterior chest ? wheezing + nasal congestion + post-nasal drainage No sinus pain/pressure No itchy/red eyes + earache No hemoptysis No SOB + low grade fever, + chills/sweats + nausea + vomiting, resolved No abdominal pain No diarrhea No urinary symptoms No skin rash + fatigue + myalgias + headache Used OTC meds without relief   Physical Exam Triage Vital Signs ED Triage Vitals  Enc Vitals Group     BP 10/05/18 1047 120/81     Pulse Rate 10/05/18 1047 (!) 106     Resp --      Temp 10/05/18 1047 99.4 F (37.4 C)     Temp Source 10/05/18  1047 Oral     SpO2 10/05/18 1047 97 %     Weight 10/05/18 1053 182 lb (82.6 kg)     Height 10/05/18 1053 5' (1.524 m)     Head Circumference --      Peak Flow --      Pain Score 10/05/18  1052 4     Pain Loc --      Pain Edu? --      Excl. in GC? --    No data found.  Updated Vital Signs BP 120/81 (BP Location: Right Arm)   Pulse (!) 106   Temp 99.4 F (37.4 C) (Oral)   Ht 5' (1.524 m)   Wt 82.6 kg   LMP 09/06/2018 (Exact Date)   SpO2 97%   BMI 35.54 kg/m   Visual Acuity Right Eye Distance:   Left Eye Distance:   Bilateral Distance:    Right Eye Near:   Left Eye Near:    Bilateral Near:     Physical Exam Nursing notes and Vital Signs reviewed. Appearance:  Patient appears stated age, and in no acute distress Eyes:  Pupils are equal, round, and reactive to light and accomodation.  Extraocular movement is intact.  Conjunctivae are not inflamed  Ears:  Canals normal.  Tympanic membranes normal.  Nose:  Mildly congested turbinates.  No sinus tenderness.  Pharynx:  Normal Neck:  Supple.  Enlarged posterior/lateral nodes are palpated bilaterally, tender to palpation on the left.   Lungs:  Bilateral faint wheezes present.  Breath sounds are equal.  Moving air well. Heart:  Regular rate and rhythm without murmurs, rubs, or gallops.  Abdomen:  Nontender without masses or hepatosplenomegaly.  Bowel sounds are present.  No CVA or flank tenderness.  Extremities:  No edema.  Skin:  No rash present.    UC Treatments / Results  Labs (all labs ordered are listed, but only abnormal results are displayed) Labs Reviewed  POCT RAPID STREP A (OFFICE) negative  POCT INFLUENZA A/B negative    EKG None  Radiology Dg Chest 2 View  Result Date: 10/05/2018 CLINICAL DATA:  Cough and fever with chest pain EXAM: CHEST - 2 VIEW COMPARISON:  April 23, 2018 FINDINGS: There is slight left base atelectasis. The lungs elsewhere are clear. Heart size and pulmonary vascularity normal. No  adenopathy. No pneumothorax. Postoperative changes noted in the lateral right clavicle, stable. IMPRESSION: Slight left base atelectasis. Lungs elsewhere clear. Stable cardiac silhouette. No evident adenopathy. Electronically Signed   By: Bretta Bang III M.D.   On: 10/05/2018 11:47    Procedures Procedures (including critical care time)  Medications Ordered in UC Medications  methylPREDNISolone sodium succinate (SOLU-MEDROL) 125 mg/2 mL injection 80 mg (has no administration in time range)    Initial Impression / Assessment and Plan / UC Course  I have reviewed the triage vital signs and the nursing notes.  Pertinent labs & imaging results that were available during my care of the patient were reviewed by me and considered in my medical decision making (see chart for details).    Note atelectasis left base that was not on previous chest X-ray 04/23/18. Begin empiric doxycycline. Administered Solumedrol , then begin prednisone burst/taper. Prescription written for Benzonatate [facility]) to take at bedtime for night-time cough.  Rx for Diflucan at patient's request. Followup with Family Doctor if not improved in one week.  Final Clinical Impressions(s) / UC Diagnoses   Final diagnoses:  Viral URI with cough  Bronchospasm, acute     Discharge Instructions     Begin prednisone Wednesday 10/06/18. Take plain guaifenesin (  extended release tabs such as Mucinex) twice daily, with plenty of water, for cough and congestion.  Get adequate rest.   May use Afrin nasal spray (or generic oxymetazoline) each morning for about 5 days and then discontinue.  Also recommend using saline nasal spray several times daily and saline nasal irrigation (AYR is a common brand).  Use Flonase nasal spray each morning after using Afrin nasal spray and saline nasal irrigation. Try warm salt water gargles for sore throat.  Stop all antihistamines for now, and other non-prescription cough/cold  preparations. May take Tylenol as needed for headache, fever, body aches, etc. Continue nebulizer at home as prescribed.      ED Prescriptions    Medication Sig Dispense Auth. Provider   ondansetron (ZOFRAN) 4 MG tablet Take 1 tablet (4 mg total) by mouth every 8 (eight) hours as needed for nausea or vomiting. 12 tablet Lattie Haw, MD   doxycycline (VIBRAMYCIN) 100 MG capsule Take 1 capsule (100 mg total) by mouth 2 (two) times daily. Take with food. 20 capsule Lattie Haw, MD   benzonatate (TESSALON) 200 MG capsule Take one cap by mouth at bedtime as needed for cough.  May repeat in 4 to 6 hours 15 capsule Lattie Haw, MD   predniSONE (DELTASONE) 20 MG tablet Take one tab by mouth twice daily for 4 days, then one daily for 3 days. Take with food. 11 tablet Lattie Haw, MD   fluconazole (DIFLUCAN) 150 MG tablet Take one tab by mouth once for yeast infection.  May repeat in 72 hours. 2 tablet Lattie Haw, MD        Lattie Haw, MD 10/11/18 2149

## 2018-10-05 NOTE — Discharge Instructions (Addendum)
Begin prednisone Wednesday 10/06/18. Take plain guaifenesin (1200mg  extended release tabs such as Mucinex) twice daily, with plenty of water, for cough and congestion.  Get adequate rest.   May use Afrin nasal spray (or generic oxymetazoline) each morning for about 5 days and then discontinue.  Also recommend using saline nasal spray several times daily and saline nasal irrigation (AYR is a common brand).  Use Flonase nasal spray each morning after using Afrin nasal spray and saline nasal irrigation. Try warm salt water gargles for sore throat.  Stop all antihistamines for now, and other non-prescription cough/cold preparations. May take Tylenol as needed for headache, fever, body aches, etc. Continue nebulizer at home as prescribed.

## 2018-10-12 ENCOUNTER — Encounter (HOSPITAL_BASED_OUTPATIENT_CLINIC_OR_DEPARTMENT_OTHER): Payer: Self-pay

## 2018-10-12 ENCOUNTER — Emergency Department (HOSPITAL_BASED_OUTPATIENT_CLINIC_OR_DEPARTMENT_OTHER)
Admission: EM | Admit: 2018-10-12 | Discharge: 2018-10-12 | Disposition: A | Discharge status: Home / Self Care | Payer: BLUE CROSS/BLUE SHIELD | Attending: Emergency Medicine | Admitting: Emergency Medicine

## 2018-10-12 ENCOUNTER — Emergency Department (HOSPITAL_BASED_OUTPATIENT_CLINIC_OR_DEPARTMENT_OTHER): Payer: BLUE CROSS/BLUE SHIELD

## 2018-10-12 ENCOUNTER — Other Ambulatory Visit: Payer: Self-pay

## 2018-10-12 DIAGNOSIS — I10 Essential (primary) hypertension: Secondary | ICD-10-CM | POA: Insufficient documentation

## 2018-10-12 DIAGNOSIS — Z20828 Contact with and (suspected) exposure to other viral communicable diseases: Secondary | ICD-10-CM | POA: Diagnosis not present

## 2018-10-12 DIAGNOSIS — Z7982 Long term (current) use of aspirin: Secondary | ICD-10-CM | POA: Diagnosis not present

## 2018-10-12 DIAGNOSIS — Z79899 Other long term (current) drug therapy: Secondary | ICD-10-CM | POA: Insufficient documentation

## 2018-10-12 DIAGNOSIS — R0602 Shortness of breath: Secondary | ICD-10-CM | POA: Insufficient documentation

## 2018-10-12 DIAGNOSIS — R05 Cough: Secondary | ICD-10-CM | POA: Diagnosis not present

## 2018-10-12 DIAGNOSIS — R509 Fever, unspecified: Secondary | ICD-10-CM | POA: Diagnosis not present

## 2018-10-12 DIAGNOSIS — R6889 Other general symptoms and signs: Secondary | ICD-10-CM

## 2018-10-12 MED ORDER — PREDNISONE 50 MG PO TABS
60.0000 mg | ORAL_TABLET | Freq: Once | ORAL | Status: AC
  Administered 2018-10-12: 60 mg via ORAL
  Filled 2018-10-12: qty 1

## 2018-10-12 NOTE — ED Notes (Signed)
Pt states currently taking doxycycline

## 2018-10-12 NOTE — ED Notes (Addendum)
Paperwork regarding corona, covid-19 reviewed with patient

## 2018-10-12 NOTE — ED Notes (Signed)
Patient transported to X-ray 

## 2018-10-12 NOTE — ED Provider Notes (Signed)
[facility] EMERGENCY DEPARTMENT Provider Note   CSN: [id number] Arrival date & time: 10/12/18  1326    History   Chief Complaint Chief Complaint  Patient presents with  . Shortness of Breath    HPI Brenda Burton is a 48 y.o. female.     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  Over the past week and a half she has had shortness of breath cough body aches low-grade fevers.  She is seen her primary care physician.  She had a negative influenza test.  She feels like she is not improving.  She does have a history of asthma.  She continues to have low-grade fevers including today.  No known contact of Covid-19 No recent travel.   Past Medical History:  Diagnosis Date  . ADHD (attention deficit hyperactivity disorder)   . Asthma   . Depression   . Hypertension   . Migraine   . Psoriatic arthritis (HCC)   . Seasonal allergies   . Thyroid disease     Patient Active Problem List   Diagnosis Date Noted  . Right foot pain 02/22/2016  . Left knee pain 02/22/2016  . ADHD 10/21/2006  . TMJ PAIN 10/21/2006  . COUGH 08/05/2006  . ALLERGIC RHINITIS, SEASONAL 05/28/2006  . Major depressive disorder, recurrent episode (HCC) 05/05/2006  . MIGRAINE, UNSPEC., W/O INTRACTABLE MIGRAINE 05/05/2006    Past Surgical History:  Procedure Laterality Date  . APPENDECTOMY    . CARDIAC SURGERY     cardiac ablations  . DILATION AND CURETTAGE OF UTERUS    . GANGLION CYST EXCISION     right wrist  . HERNIA REPAIR    . OVARIAN CYST REMOVAL    . SHOULDER SURGERY     right   . TONSILLECTOMY       OB History    Gravida  1   Para      Term      Preterm      AB      Living        SAB      TAB      Ectopic      Multiple      Live Births               Home Medications    Prior to Admission medications   Medication Sig Start Date End Date Taking? Authorizing Provider  albuterol (PROVENTIL HFA;VENTOLIN HFA) 108  (90 BASE) MCG/ACT inhaler Inhale 2 puffs into the lungs every 4 (four) hours as needed for wheezing or shortness of breath. 10/21/14   Lattie Haw, MD  albuterol (PROVENTIL HFA;VENTOLIN HFA) 108 (90 Base) MCG/ACT inhaler Inhale into the lungs. 10/21/14   [provider]  aspirin EC 81 MG tablet Take by mouth.    [provider]  benzonatate (TESSALON) 200 MG capsule Take one cap by mouth at bedtime as needed for cough.  May repeat in 4 to 6 hours 10/05/18   Lattie Haw, MD  buPROPion [facility] SR) 200 MG 12 hr tablet TAKE 1 TABLET BY MOUTH ONCE DAILY 08/30/18   Thresa Ross, MD  doxycycline (VIBRAMYCIN) 100 MG capsule Take 1 capsule (100 mg total) by mouth 2 (two) times daily. Take with food. 10/05/18   Lattie Haw, MD  DULoxetine (CYMBALTA) 30 MG capsule TAKE 1 CAPSULE BY MOUTH ONCE DAILY 08/30/18   Thresa Ross, MD  DULoxetine (CYMBALTA) 60 MG  capsule TAKE 1 CAPSULE BY MOUTH ONCE DAILY 08/30/18   Thresa Ross, MD  etanercept (ENBREL SURECLICK) 50 MG/ML injection INJECT ONE pen SUBCUTANEOUSLY EVERY WEEK 06/16/17   [provider]  fluconazole (DIFLUCAN) 150 MG tablet Take one tab by mouth once for yeast infection.  May repeat in 72 hours. 10/05/18   Lattie Haw, MD  fluticasone (FLONASE) 50 MCG/ACT nasal spray Place into the nose. 05/06/18 05/06/19  [provider]  furosemide (LASIX) 20 MG tablet Take 20 mg by mouth.    [provider]  levothyroxine (SYNTHROID, LEVOTHROID) 25 MCG tablet Take by mouth.    [provider]  loratadine (CLARITIN) 10 MG tablet Take 10 mg by mouth daily.    [provider]  losartan (COZAAR) 25 MG tablet TAKE ONE TABLET BY MOUTH ONCE DAILY 12/25/16   [provider]  mometasone-formoterol (DULERA) 200-5 MCG/ACT AERO 2 puffs 2 (two) times daily.  07/10/16   [provider]  montelukast (SINGULAIR) 10 MG tablet Take 10 mg by mouth at bedtime.    [provider]   montelukast (SINGULAIR) 10 MG tablet Take 10 mg by mouth Nightly.    [provider]  norethindrone-ethinyl estradiol (JUNEL FE,GILDESS FE,LOESTRIN FE) 1-20 MG-MCG tablet Take 1 tablet by mouth daily.    [provider]  ondansetron (ZOFRAN) 4 MG tablet Take 1 tablet (4 mg total) by mouth every 8 (eight) hours as needed for nausea or vomiting. 10/05/18   Lattie Haw, MD  predniSONE (DELTASONE) 20 MG tablet Take one tab by mouth twice daily for 4 days, then one daily for 3 days. Take with food. 10/05/18   Lattie Haw, MD  ARIPiprazole (ABILIFY) 10 MG tablet Take 1 tablet (10 mg total) by mouth daily. 07/13/17 09/10/17  Thresa Ross, MD  clonazePAM (KLONOPIN) 0.5 MG tablet Take 1 tablet (0.5 mg total) by mouth daily as needed for anxiety. Can take half  At times one tablet for anxiety prn 09/10/17 11/23/17  Thresa Ross, MD    Family History Family History  Problem Relation Age of Onset  . Migraines Mother   . GER disease Mother   . Narcolepsy Mother   . Restless legs syndrome Mother     Social History Social History   Tobacco Use  . Smoking status: Never Smoker  . Smokeless tobacco: Never Used  Substance Use Topics  . Alcohol use: No  . Drug use: No     Allergies   Bactrim; Biaxin [clarithromycin]; Clarithromycin; Dilaudid [hydromorphone hcl]; Hydromorphone hcl; Imitrex [sumatriptan]; Nalbuphine; and Penicillins   Review of Systems Review of Systems  All other systems reviewed and are negative.    Physical Exam Updated Vital Signs BP (!) 135/103 (BP Location: Right Arm)   Pulse (!) 101   Temp 98.7 F (37.1 C) (Oral)   Resp 17   Ht 5' (1.524 m)   Wt 82.6 kg   LMP 10/07/2018   SpO2 99%   BMI 35.54 kg/m   Physical Exam Vitals signs and nursing note reviewed.  Constitutional:      General: She is not in acute distress.    Appearance: She is well-developed.  HENT:     Head: Normocephalic and atraumatic.  Neck:     Musculoskeletal:  Normal range of motion.  Cardiovascular:     Rate and Rhythm: Normal rate and regular rhythm.     Heart sounds: Normal heart sounds.  Pulmonary:     Effort: Pulmonary effort is normal.  Breath sounds: Wheezing present.  Abdominal:     General: There is no distension.     Palpations: Abdomen is soft.     Tenderness: There is no abdominal tenderness.  Musculoskeletal: Normal range of motion.  Skin:    General: Skin is warm and dry.  Neurological:     Mental Status: She is alert and oriented to person, place, and time.  Psychiatric:        Judgment: Judgment normal.      ED Treatments / Results  Labs (all labs ordered are listed, but only abnormal results are displayed) Labs Reviewed  NOVEL CORONAVIRUS, NAA (HOSPITAL ORDER, SEND-OUT TO REF LAB)    EKG None  Radiology Dg Chest 2 View  Result Date: 10/12/2018 CLINICAL DATA:  Fever and cough EXAM: CHEST - 2 VIEW COMPARISON:  10/05/2018 FINDINGS: The heart size and mediastinal contours are within normal limits. Both lungs are clear. The visualized skeletal structures are unremarkable. IMPRESSION: No active cardiopulmonary disease. Electronically Signed   By: Alcide Clever M.D.   On: 10/12/2018 14:26    Procedures Procedures (including critical care time)  Medications Ordered in ED Medications - No data to display   Initial Impression / Assessment and Plan / ED Course  I have reviewed the triage vital signs and the nursing notes.  Pertinent labs & imaging results that were available during my care of the patient were reviewed by me and considered in my medical decision making (see chart for details).        Upper respiratory symptoms.  Negative influenza.  Patient will be checked for novel coronavirus. PUI paperwork to be filled out by nursing.  Steroids now.  Chest x-ray pending.   Final Clinical Impressions(s) / ED Diagnoses   Final diagnoses:  Flu-like symptoms    ED Discharge Orders    None        Azalia Bilis, MD 10/13/18 [phone]

## 2018-10-12 NOTE — Discharge Instructions (Signed)
You were seen in the emergency department today with a flulike illness.  We are sending testing for the novel coronavirus.  You need to self quarantine for 14 days or until you are told that your test is negative.  I have written a work note to be off for that period of time.  Return to the emergency department with any sudden worsening symptoms.  You can low up with your PCP either by phone or by telehealth to prevent going into the office while sick.

## 2018-10-12 NOTE — ED Triage Notes (Signed)
Pt presents with SOB- cough- bodyaches- low grade fevers. Pt seen at PCP and pulmonology with neg x-ray- flu swab and resp panel. 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 Pt hx of asthma. No known contact of Covid-19

## 2018-10-12 NOTE — ED Provider Notes (Signed)
Blood pressure (!) 132/91, pulse 100, temperature 99 F (37.2 C), temperature source Oral, resp. rate 18, height 5' (1.524 m), weight 82.6 kg, last menstrual period 10/07/2018, SpO2 99 %.  Assuming care from Dr. Patria Mane.  In short, Brenda Burton is a 48 y.o. female with a chief complaint of Shortness of Breath .  Refer to the original H&P for additional details.  The current plan of care is to f/u CXR.  04:50 PM  Chest x-ray is negative.  Sats are normal.  COVID testing initiated by EDP.  Patient understands self quarantine plan and return precautions.  Also understands additional evaluation by telehealth as a possibility. Plan for discharge.     Maia Plan, MD 10/12/18 1650

## 2018-10-16 LAB — NOVEL CORONAVIRUS, NAA (HOSP ORDER, SEND-OUT TO REF LAB; TAT 18-24 HRS): SARS-CoV-2, NAA: NOT DETECTED

## 2018-10-22 ENCOUNTER — Other Ambulatory Visit (HOSPITAL_COMMUNITY): Payer: Self-pay | Admitting: Psychiatry

## 2018-10-28 DIAGNOSIS — B9689 Other specified bacterial agents as the cause of diseases classified elsewhere: Secondary | ICD-10-CM | POA: Insufficient documentation

## 2018-10-28 DIAGNOSIS — J208 Acute bronchitis due to other specified organisms: Secondary | ICD-10-CM | POA: Insufficient documentation

## 2018-12-14 DIAGNOSIS — J4530 Mild persistent asthma, uncomplicated: Secondary | ICD-10-CM | POA: Insufficient documentation

## 2019-04-05 DIAGNOSIS — R7301 Impaired fasting glucose: Secondary | ICD-10-CM | POA: Insufficient documentation

## 2019-09-13 DIAGNOSIS — Z8616 Personal history of COVID-19: Secondary | ICD-10-CM | POA: Insufficient documentation

## 2019-11-14 ENCOUNTER — Other Ambulatory Visit: Payer: Self-pay

## 2019-11-14 ENCOUNTER — Emergency Department (INDEPENDENT_AMBULATORY_CARE_PROVIDER_SITE_OTHER)
Admission: EM | Admit: 2019-11-14 | Discharge: 2019-11-14 | Disposition: A | Discharge status: Home / Self Care | Payer: BLUE CROSS/BLUE SHIELD | Source: Home / Self Care | Attending: Family Medicine | Admitting: Family Medicine

## 2019-11-14 DIAGNOSIS — L03112 Cellulitis of left axilla: Secondary | ICD-10-CM | POA: Diagnosis not present

## 2019-11-14 NOTE — Discharge Instructions (Addendum)
Begin doxycycline as prescribed. Apply warm compress several times daily.  Recommend that you have your vitamin D level checked.

## 2019-11-14 NOTE — ED Provider Notes (Signed)
[name]    CSN: [id number] Arrival date & time: 11/14/19  1932      History   Chief Complaint Chief Complaint  Patient presents with  . Abscess    HPI Brenda Burton is a 49 y.o. female.   Patient complains of a painful area in her left axilla for about ten day, concerning for a possible abscess.  She denies drainage from the lesion, and no fevers, chills, and sweats.  Patient has a prescription for doxycycline (not yet started) that was prescribed for an incisional abscess after placement of an implantable loop recorder.     The history is provided by the patient.    Past Medical History:  Diagnosis Date  . ADHD (attention deficit hyperactivity disorder)   . Asthma   . Depression   . Hypertension   . Migraine   . Psoriatic arthritis ([place])   . Seasonal allergies   . Thyroid disease     Patient Active Problem List   Diagnosis Date Noted  . Right foot pain 02/22/2016  . Left knee pain 02/22/2016  . ADHD 10/21/2006  . TMJ PAIN 10/21/2006  . COUGH 08/05/2006  . ALLERGIC RHINITIS, SEASONAL 05/28/2006  . Major depressive disorder, recurrent episode ([place]) 05/05/2006  . MIGRAINE, UNSPEC., W/O INTRACTABLE MIGRAINE 05/05/2006    Past Surgical History:  Procedure Laterality Date  . APPENDECTOMY    . CARDIAC SURGERY     cardiac ablations  . DILATION AND CURETTAGE OF UTERUS    . GANGLION CYST EXCISION     right wrist  . HERNIA REPAIR    . OVARIAN CYST REMOVAL    . SHOULDER SURGERY     right   . TONSILLECTOMY      OB History    Gravida  1   Para      Term      Preterm      AB      Living        SAB      TAB      Ectopic      Multiple      Live Births               Home Medications    Prior to Admission medications   Medication Sig Start Date End Date Taking? Authorizing Provider  albuterol (PROVENTIL HFA;VENTOLIN HFA) 108 (90 BASE) MCG/ACT inhaler Inhale 2 puffs into the lungs every 4 (four) hours as needed for  wheezing or shortness of breath. 10/21/14   Kandra Nicolas, MD  albuterol (PROVENTIL HFA;VENTOLIN HFA) 108 (90 Base) MCG/ACT inhaler Inhale into the lungs. 10/21/14   [provider]  aspirin EC 81 MG tablet Take by mouth.    [provider]  benzonatate (TESSALON) 200 MG capsule Take one cap by mouth at bedtime as needed for cough.  May repeat in 4 to 6 hours 10/05/18   Kandra Nicolas, MD  buPROPion [facility] SR) 200 MG 12 hr tablet TAKE 1 TABLET BY MOUTH ONCE DAILY 08/30/18   Merian Capron, MD  doxycycline (VIBRAMYCIN) 100 MG capsule Take 1 capsule (100 mg total) by mouth 2 (two) times daily. Take with food. 10/05/18   Kandra Nicolas, MD  DULoxetine (CYMBALTA) 30 MG capsule TAKE 1 CAPSULE BY MOUTH ONCE DAILY 08/30/18   Merian Capron, MD  DULoxetine (CYMBALTA) 60 MG capsule TAKE 1 CAPSULE BY MOUTH ONCE DAILY 08/30/18   Merian Capron, MD  etanercept (ENBREL SURECLICK) 50 MG/ML injection  INJECT ONE pen SUBCUTANEOUSLY EVERY WEEK 06/16/17   [provider]  fluconazole (DIFLUCAN) 150 MG tablet Take one tab by mouth once for yeast infection.  May repeat in 72 hours. 10/05/18   Lattie Haw, MD  fluticasone (FLONASE) 50 MCG/ACT nasal spray Place into the nose. 05/06/18 05/06/19  [provider]  furosemide (LASIX) 20 MG tablet Take 20 mg by mouth.    [provider]  levothyroxine (SYNTHROID, LEVOTHROID) 25 MCG tablet Take by mouth.    [provider]  loratadine (CLARITIN) 10 MG tablet Take 10 mg by mouth daily.    [provider]  losartan (COZAAR) 25 MG tablet TAKE ONE TABLET BY MOUTH ONCE DAILY 12/25/16   [provider]  mometasone-formoterol (DULERA) 200-5 MCG/ACT AERO 2 puffs 2 (two) times daily.  07/10/16   [provider]  montelukast (SINGULAIR) 10 MG tablet Take 10 mg by mouth at bedtime.    [provider]  montelukast (SINGULAIR) 10 MG tablet Take 10 mg by mouth Nightly.    [provider]    norethindrone-ethinyl estradiol (JUNEL FE,GILDESS FE,LOESTRIN FE) 1-20 MG-MCG tablet Take 1 tablet by mouth daily.    [provider]  ondansetron (ZOFRAN) 4 MG tablet Take 1 tablet (4 mg total) by mouth every 8 (eight) hours as needed for nausea or vomiting. 10/05/18   Lattie Haw, MD  predniSONE (DELTASONE) 20 MG tablet Take one tab by mouth twice daily for 4 days, then one daily for 3 days. Take with food. 10/05/18   Lattie Haw, MD  ARIPiprazole (ABILIFY) 10 MG tablet Take 1 tablet (10 mg total) by mouth daily. 07/13/17 09/10/17  Thresa Ross, MD  clonazePAM (KLONOPIN) 0.5 MG tablet Take 1 tablet (0.5 mg total) by mouth daily as needed for anxiety. Can take half  At times one tablet for anxiety prn 09/10/17 11/23/17  Thresa Ross, MD    Family History Family History  Problem Relation Age of Onset  . Migraines Mother   . GER disease Mother   . Narcolepsy Mother   . Restless legs syndrome Mother     Social History Social History   Tobacco Use  . Smoking status: Never Smoker  . Smokeless tobacco: Never Used  Substance Use Topics  . Alcohol use: No  . Drug use: No     Allergies   Bactrim, Biaxin [clarithromycin], Clarithromycin, Dilaudid [hydromorphone hcl], Hydromorphone hcl, Imitrex [sumatriptan], Nalbuphine, Penicillins, and Vancomycin   Review of Systems Review of Systems  Constitutional: Negative for activity change, chills, diaphoresis, fatigue and fever.  Gastrointestinal: Negative for nausea.  Skin: Positive for color change.  All other systems reviewed and are negative.    Physical Exam Triage Vital Signs ED Triage Vitals  Enc Vitals Group     BP 11/14/19 2008 (!) 155/100     Pulse Rate 11/14/19 2008 (!) 108     Resp 11/14/19 2008 18     Temp 11/14/19 2008 99.2 F (37.3 C)     Temp Source 11/14/19 2008 Oral     SpO2 11/14/19 2008 99 %     Weight --      Height --      Head Circumference --      Peak Flow --      Pain Score 11/14/19  2000 6     Pain Loc --      Pain Edu? --      Excl. in GC? --    No data  found.  Updated Vital Signs BP (!) 155/100 (BP Location: Right Arm)   Pulse (!) 108   Temp 99.2 F (37.3 C) (Oral)   Resp 18   SpO2 99%   Visual Acuity Right Eye Distance:   Left Eye Distance:   Bilateral Distance:    Right Eye Near:   Left Eye Near:    Bilateral Near:     Physical Exam Vitals and nursing note reviewed.  Constitutional:      General: She is not in acute distress. HENT:     Head: Normocephalic.  Eyes:     Pupils: Pupils are equal, round, and reactive to light.  Cardiovascular:     Rate and Rhythm: Tachycardia present.  Pulmonary:     Effort: Pulmonary effort is normal.  Skin:    General: Skin is warm and dry.     Comments: Left axilla has a 1.5cm by 2cm indurated area of erythema, tender to palpation.  There are shotty tender axillary nodes present.  Neurological:     Mental Status: She is alert.      UC Treatments / Results  Labs (all labs ordered are listed, but only abnormal results are displayed) Labs Reviewed - No data to display  EKG   Radiology No results found.  Procedures Procedures (including critical care time)  Medications Ordered in UC Medications - No data to display  Initial Impression / Assessment and Plan / UC Course  I have reviewed the triage vital signs and the nursing notes.  Pertinent labs & imaging results that were available during my care of the patient were reviewed by me and considered in my medical decision making (see chart for details).    Lesion left axilla indurated but not fluctuant. Return if increased pain/swelling develops (may need I and D)   Review of chart records reveals low vitamin D 25-OH 29.8 ng/mL on 03/08/19. Final Clinical Impressions(s) / UC Diagnoses   Final diagnoses:  Cellulitis of axilla, left     Discharge Instructions     Begin doxycycline as prescribed. Apply warm compress several times  daily.  Recommend that you have your vitamin D level checked.    ED Prescriptions    None        Lattie Haw, MD 11/16/19 2016

## 2019-11-14 NOTE — ED Triage Notes (Signed)
Patient presents to Urgent Care with complaints of abscess under her left axilla since about 10 days ago. Patient reports she thought it would go away and it has gotten worse. Pt states it is not open or draining. Pt is supposed to be on doxycycline for surgeries she has not started it yet. Pt is a Engineer, civil (consulting), has been using dial soap.

## 2019-12-09 ENCOUNTER — Emergency Department (INDEPENDENT_AMBULATORY_CARE_PROVIDER_SITE_OTHER): Payer: BLUE CROSS/BLUE SHIELD

## 2019-12-09 ENCOUNTER — Emergency Department (INDEPENDENT_AMBULATORY_CARE_PROVIDER_SITE_OTHER)
Admission: EM | Admit: 2019-12-09 | Discharge: 2019-12-09 | Disposition: A | Discharge status: Home / Self Care | Payer: BLUE CROSS/BLUE SHIELD | Source: Home / Self Care

## 2019-12-09 ENCOUNTER — Other Ambulatory Visit: Payer: Self-pay

## 2019-12-09 DIAGNOSIS — R112 Nausea with vomiting, unspecified: Secondary | ICD-10-CM

## 2019-12-09 DIAGNOSIS — R05 Cough: Secondary | ICD-10-CM

## 2019-12-09 DIAGNOSIS — R197 Diarrhea, unspecified: Secondary | ICD-10-CM

## 2019-12-09 DIAGNOSIS — R058 Other specified cough: Secondary | ICD-10-CM

## 2019-12-09 LAB — POCT CBC W AUTO DIFF (K'VILLE URGENT CARE)

## 2019-12-09 MED ORDER — ONDANSETRON 4 MG PO TBDP: 4.0000 mg | ORAL_TABLET | Freq: Three times a day (TID) | ORAL | 0 refills | Status: DC | PRN

## 2019-12-09 NOTE — ED Triage Notes (Signed)
Pt c/o bodyaches, nausea, vomiting, diarrhea, and chills since Tuesday. Says she had covid already back in December. Has not had the covid vaccine yet.

## 2019-12-09 NOTE — ED Provider Notes (Signed)
[facility]    CSN: [id number] Arrival date & time: 12/09/19  1359      History   Chief Complaint Chief Complaint  Patient presents with  . Emesis  . Nausea  . Fever    bodyaches    HPI Brenda Burton is a 49 y.o. female.   HPI  Brenda Burton is a 49 y.o. female presenting to UC with c/o 4 days of n/v/d with associated chills, body aches, and productive cough with green sputum. Pt reports having Covid in December 2020.  She has taken OTC tylenol for low grade fever of 99.7*F. last episode of vomiting was 2 days ago but she has had loose to watery stool every hour since symptoms started 4 days ago. Denies blood or mucous in the stool. Generalized abdominal cramping that is mild.  She notes her two daughters have similar symptoms, including her 20yo daughter who is also at [facility] today, who's symptoms started today.  Pt denies recent travel. Denies chest pain or SOB.     Past Medical History:  Diagnosis Date  . ADHD (attention deficit hyperactivity disorder)   . Asthma   . Depression   . Hypertension   . Migraine   . Psoriatic arthritis ([facility])   . Seasonal allergies   . Thyroid disease     Patient Active Problem List   Diagnosis Date Noted  . Right foot pain 02/22/2016  . Left knee pain 02/22/2016  . ADHD 10/21/2006  . TMJ PAIN 10/21/2006  . COUGH 08/05/2006  . ALLERGIC RHINITIS, SEASONAL 05/28/2006  . Major depressive disorder, recurrent episode ([facility]) 05/05/2006  . MIGRAINE, UNSPEC., W/O INTRACTABLE MIGRAINE 05/05/2006    Past Surgical History:  Procedure Laterality Date  . APPENDECTOMY    . CARDIAC SURGERY     cardiac ablations  . DILATION AND CURETTAGE OF UTERUS    . GANGLION CYST EXCISION     right wrist  . HERNIA REPAIR    . OVARIAN CYST REMOVAL    . SHOULDER SURGERY     right   . TONSILLECTOMY      OB History    Gravida  1   Para      Term      Preterm      AB      Living        SAB      TAB      Ectopic      Multiple       Live Births               Home Medications    Prior to Admission medications   Medication Sig Start Date End Date Taking? Authorizing Provider  albuterol (PROVENTIL HFA;VENTOLIN HFA) 108 (90 BASE) MCG/ACT inhaler Inhale 2 puffs into the lungs every 4 (four) hours as needed for wheezing or shortness of breath. 10/21/14   Lattie Haw, MD  albuterol (PROVENTIL HFA;VENTOLIN HFA) 108 (90 Base) MCG/ACT inhaler Inhale into the lungs. 10/21/14   [provider]  aspirin EC 81 MG tablet Take by mouth.    [provider]  benzonatate (TESSALON) 200 MG capsule Take one cap by mouth at bedtime as needed for cough.  May repeat in 4 to 6 hours 10/05/18   Lattie Haw, MD  buPROPion [facility] SR) 200 MG 12 hr tablet TAKE 1 TABLET BY MOUTH ONCE DAILY 08/30/18   Thresa Ross, MD  doxycycline (VIBRAMYCIN) 100 MG capsule Take 1 capsule (100  mg total) by mouth 2 (two) times daily. Take with food. 10/05/18   Lattie Haw, MD  DULoxetine (CYMBALTA) 30 MG capsule TAKE 1 CAPSULE BY MOUTH ONCE DAILY 08/30/18   Thresa Ross, MD  DULoxetine (CYMBALTA) 60 MG capsule TAKE 1 CAPSULE BY MOUTH ONCE DAILY 08/30/18   Thresa Ross, MD  etanercept (ENBREL SURECLICK) 50 MG/ML injection INJECT ONE pen SUBCUTANEOUSLY EVERY WEEK 06/16/17   [provider]  fluconazole (DIFLUCAN) 150 MG tablet Take one tab by mouth once for yeast infection.  May repeat in 72 hours. 10/05/18   Lattie Haw, MD  fluticasone (FLONASE) 50 MCG/ACT nasal spray Place into the nose. 05/06/18 05/06/19  [provider]  furosemide (LASIX) 20 MG tablet Take 20 mg by mouth.    [provider]  levothyroxine (SYNTHROID, LEVOTHROID) 25 MCG tablet Take by mouth.    [provider]  loratadine (CLARITIN) 10 MG tablet Take 10 mg by mouth daily.    [provider]  losartan (COZAAR) 25 MG tablet TAKE ONE TABLET BY MOUTH ONCE DAILY 12/25/16   [provider]    mometasone-formoterol (DULERA) 200-5 MCG/ACT AERO 2 puffs 2 (two) times daily.  07/10/16   [provider]  montelukast (SINGULAIR) 10 MG tablet Take 10 mg by mouth at bedtime.    [provider]  montelukast (SINGULAIR) 10 MG tablet Take 10 mg by mouth Nightly.    [provider]  norethindrone-ethinyl estradiol (JUNEL FE,GILDESS FE,LOESTRIN FE) 1-20 MG-MCG tablet Take 1 tablet by mouth daily.    [provider]  ondansetron (ZOFRAN ODT) 4 MG disintegrating tablet Take 1 tablet (4 mg total) by mouth every 8 (eight) hours as needed. 12/09/19   Lurene Shadow, PA-C  ondansetron (ZOFRAN) 4 MG tablet Take 1 tablet (4 mg total) by mouth every 8 (eight) hours as needed for nausea or vomiting. 10/05/18   Lattie Haw, MD  predniSONE (DELTASONE) 20 MG tablet Take one tab by mouth twice daily for 4 days, then one daily for 3 days. Take with food. 10/05/18   Lattie Haw, MD  ARIPiprazole (ABILIFY) 10 MG tablet Take 1 tablet (10 mg total) by mouth daily. 07/13/17 09/10/17  Thresa Ross, MD  clonazePAM (KLONOPIN) 0.5 MG tablet Take 1 tablet (0.5 mg total) by mouth daily as needed for anxiety. Can take half  At times one tablet for anxiety prn 09/10/17 11/23/17  Thresa Ross, MD    Family History Family History  Problem Relation Age of Onset  . Migraines Mother   . GER disease Mother   . Narcolepsy Mother   . Restless legs syndrome Mother     Social History Social History   Tobacco Use  . Smoking status: Never Smoker  . Smokeless tobacco: Never Used  Substance Use Topics  . Alcohol use: No  . Drug use: No     Allergies   Bactrim, Biaxin [clarithromycin], Clarithromycin, Dilaudid [hydromorphone hcl], Hydromorphone hcl, Imitrex [sumatriptan], Nalbuphine, Penicillins, and Vancomycin   Review of Systems Review of Systems  Constitutional: Positive for chills and fever.  HENT: Positive for congestion. Negative for ear pain, sore throat, trouble  swallowing and voice change.   Respiratory: Positive for cough. Negative for shortness of breath.   Cardiovascular: Negative for chest pain and palpitations.  Gastrointestinal: Positive for abdominal pain, diarrhea, nausea and vomiting.  Genitourinary: Negative for dysuria and flank pain.  Musculoskeletal: Positive for arthralgias, back pain and myalgias.  Skin: Negative for rash.  Neurological:  Negative for dizziness, light-headedness and headaches.  All other systems reviewed and are negative.    Physical Exam Triage Vital Signs ED Triage Vitals  Enc Vitals Group     BP 12/09/19 1411 (!) 137/92     Pulse Rate 12/09/19 1411 (!) 108     Resp 12/09/19 1411 18     Temp 12/09/19 1411 99 F (37.2 C)     Temp Source 12/09/19 1411 Oral     SpO2 12/09/19 1411 97 %     Weight --      Height --      Head Circumference --      Peak Flow --      Pain Score 12/09/19 1412 0     Pain Loc --      Pain Edu? --      Excl. in GC? --    No data found.  Updated Vital Signs BP (!) 137/92 (BP Location: Right Arm)   Pulse (!) 108   Temp 99 F (37.2 C) (Oral)   Resp 18   LMP 12/07/2019   SpO2 97%   Visual Acuity Right Eye Distance:   Left Eye Distance:   Bilateral Distance:    Right Eye Near:   Left Eye Near:    Bilateral Near:     Physical Exam Vitals and nursing note reviewed.  Constitutional:      Appearance: Normal appearance. She is well-developed.  HENT:     Head: Normocephalic and atraumatic.     Right Ear: Tympanic membrane and ear canal normal.     Left Ear: Tympanic membrane and ear canal normal.     Nose: Nose normal.     Mouth/Throat:     Mouth: Mucous membranes are moist.     Pharynx: Oropharynx is clear.  Eyes:     Extraocular Movements: Extraocular movements intact.     Conjunctiva/sclera: Conjunctivae normal.  Cardiovascular:     Rate and Rhythm: Regular rhythm. Tachycardia present.     Comments: Mild tachycardia, regular rhythm. Pulmonary:     Effort:  Pulmonary effort is normal. No respiratory distress.     Breath sounds: Normal breath sounds. No stridor. No wheezing, rhonchi or rales.  Abdominal:     General: There is no distension.     Palpations: Abdomen is soft.     Tenderness: There is abdominal tenderness (mild, generalized). There is no right CVA tenderness or left CVA tenderness.  Musculoskeletal:        General: Normal range of motion.     Cervical back: Normal range of motion.  Skin:    General: Skin is warm and dry.  Neurological:     Mental Status: She is alert and oriented to person, place, and time.  Psychiatric:        Behavior: Behavior normal.      UC Treatments / Results  Labs (all labs ordered are listed, but only abnormal results are displayed) Labs Reviewed  NOVEL CORONAVIRUS, NAA  COMPLETE METABOLIC PANEL WITH GFR  LIPASE  GASTROINTESTINAL PATHOGEN PANEL PCR  POCT CBC W AUTO DIFF ([facility])    EKG   Radiology DG Chest 2 View  Result Date: 12/09/2019 CLINICAL DATA:  Cough with fatigue EXAM: CHEST - 2 VIEW COMPARISON:  Chest CT September 17, 2019 FINDINGS: Lungs are clear. Heart size and pulmonary vascularity are normal. No adenopathy. There is a loop recorder on the left anteriorly. There is evidence of prior resection of the lateral right clavicle.  IMPRESSION: Lungs clear.  Cardiac silhouette within normal limits. Electronically Signed   By: Lowella Grip III M.D.   On: 12/09/2019 15:17    Procedures Procedures (including critical care time)  Medications Ordered in UC Medications - No data to display  Initial Impression / Assessment and Plan / UC Course  I have reviewed the triage vital signs and the nursing notes.  Pertinent labs & imaging results that were available during my care of the patient were reviewed by me and considered in my medical decision making (see chart for details).     Hx and exam c/w viral illness CBC: unremarkable CMP, Lipase, Covid, and GI panel-  pending Discussed symptoms that warrant emergent care in the ED. AVS provided  Final Clinical Impressions(s) / UC Diagnoses   Final diagnoses:  Nausea vomiting and diarrhea  Productive cough     Discharge Instructions      Begin Pedialye for about 12 to 18 hours until diarrhea stops, then switch to clear liquids (apple juice, clear grape juice, Jello, etc) for about 12 to 18 hours.  When improved, advance to a [employer] (Bananas, Rice, Applesauce, Toast). Then gradually resume a regular diet when tolerated.  Avoid milk products until well.  When stools become more formed, may take Imodium (loperamide) once or twice daily to decrease stool frequency.  If symptoms become significantly worse during the night or over the weekend, proceed to the local emergency room.     ED Prescriptions    Medication Sig Dispense Auth. Provider   ondansetron (ZOFRAN ODT) 4 MG disintegrating tablet Take 1 tablet (4 mg total) by mouth every 8 (eight) hours as needed. 4 tablet Noe Gens, PA-C     PDMP not reviewed this encounter.   Noe Gens, [place] 12/09/19 1615

## 2019-12-09 NOTE — Discharge Instructions (Signed)
°  Begin Pedialye for about 12 to 18 hours until diarrhea stops, then switch to clear liquids (apple juice, clear grape juice, Jello, etc) for about 12 to 18 hours.  When improved, advance to a BRAT diet (Bananas, Rice, Applesauce, Toast). Then gradually resume a regular diet when tolerated.  Avoid milk products until well.  When stools become more formed, may take Imodium (loperamide) once or twice daily to decrease stool frequency.  °If symptoms become significantly worse during the night or over the weekend, proceed to the local emergency room. ° ° °

## 2019-12-10 LAB — COMPLETE METABOLIC PANEL WITH GFR
AG Ratio: 1.5 (calc) (ref 1.0–2.5)
ALT: 12 U/L (ref 6–29)
AST: 15 U/L (ref 10–35)
Albumin: 4.1 g/dL (ref 3.6–5.1)
Alkaline phosphatase (APISO): 90 U/L (ref 31–125)
BUN: 9 mg/dL (ref 7–25)
CO2: 23 mmol/L (ref 20–32)
Calcium: 9.2 mg/dL (ref 8.6–10.2)
Chloride: 104 mmol/L (ref 98–110)
Creat: 0.73 mg/dL (ref 0.50–1.10)
GFR, Est African American: 113 mL/min/{1.73_m2} (ref >=60)
GFR, Est Non African American: 97 mL/min/{1.73_m2} (ref >=60)
Globulin: 2.7 g/dL (calc) (ref 1.9–3.7)
Glucose, Bld: 111 mg/dL — ABNORMAL HIGH (ref 65–99)
Potassium: 3.9 mmol/L (ref 3.5–5.3)
Sodium: 138 mmol/L (ref 135–146)
Total Bilirubin: 0.5 mg/dL (ref 0.2–1.2)
Total Protein: 6.8 g/dL (ref 6.1–8.1)

## 2019-12-10 LAB — NOVEL CORONAVIRUS, NAA: SARS-CoV-2, NAA: NOT DETECTED

## 2019-12-10 LAB — LIPASE: Lipase: 24 U/L (ref 7–60)

## 2019-12-10 LAB — SARS-COV-2, NAA 2 DAY TAT

## 2020-03-14 ENCOUNTER — Emergency Department (INDEPENDENT_AMBULATORY_CARE_PROVIDER_SITE_OTHER)
Admission: EM | Admit: 2020-03-14 | Discharge: 2020-03-14 | Disposition: A | Discharge status: Home / Self Care | Payer: BLUE CROSS/BLUE SHIELD | Source: Home / Self Care

## 2020-03-14 ENCOUNTER — Encounter: Payer: Self-pay | Admitting: Emergency Medicine

## 2020-03-14 ENCOUNTER — Other Ambulatory Visit: Payer: Self-pay

## 2020-03-14 DIAGNOSIS — B009 Herpesviral infection, unspecified: Secondary | ICD-10-CM | POA: Insufficient documentation

## 2020-03-14 DIAGNOSIS — R05 Cough: Secondary | ICD-10-CM | POA: Diagnosis not present

## 2020-03-14 DIAGNOSIS — Z20822 Contact with and (suspected) exposure to covid-19: Secondary | ICD-10-CM | POA: Diagnosis not present

## 2020-03-14 DIAGNOSIS — R058 Other specified cough: Secondary | ICD-10-CM

## 2020-03-14 DIAGNOSIS — J029 Acute pharyngitis, unspecified: Secondary | ICD-10-CM

## 2020-03-14 LAB — POCT RAPID STREP A (OFFICE): Rapid Strep A Screen: NEGATIVE

## 2020-03-14 MED ORDER — LIDOCAINE VISCOUS HCL 2 % MT SOLN: 15.0000 mL | OROMUCOSAL | 0 refills | Status: DC | PRN

## 2020-03-14 MED ORDER — BENZONATATE 200 MG PO CAPS: ORAL_CAPSULE | ORAL | 0 refills | Status: DC

## 2020-03-14 MED ORDER — DOXYCYCLINE HYCLATE 100 MG PO CAPS: 100.0000 mg | ORAL_CAPSULE | Freq: Two times a day (BID) | ORAL | 0 refills | Status: DC

## 2020-03-14 MED ORDER — PREDNISONE 20 MG PO TABS: 40.0000 mg | ORAL_TABLET | Freq: Every day | ORAL | 0 refills | Status: AC

## 2020-03-14 NOTE — ED Triage Notes (Signed)
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

## 2020-03-14 NOTE — ED Provider Notes (Signed)
[facility]    CSN: [id number] Arrival date & time: 03/14/20  1851      History   Chief Complaint Chief Complaint  Patient presents with  . Fatigue  . Sore Throat    HPI Brenda Burton is a 49 y.o. female.   HPI  Presents with COVID-19 like symptoms x 3-4 days. Patient recently at hospital with father for evaluation of medical problem unrelated to COVID-19. She began to develop symptoms of sore throat, congestion, cough, and wheezing. Denies fever. She is unvaccinated. She had COVID infection previously > 6 months ago. Endorses fatigue. Denies loss of taste or smell.  Past Medical History:  Diagnosis Date  . ADHD (attention deficit hyperactivity disorder)   . Asthma   . Depression   . Hypertension   . Migraine   . Psoriatic arthritis (HCC)   . Seasonal allergies   . Thyroid disease     Patient Active Problem List   Diagnosis Date Noted  . Right foot pain 02/22/2016  . Left knee pain 02/22/2016  . ADHD 10/21/2006  . TMJ PAIN 10/21/2006  . COUGH 08/05/2006  . ALLERGIC RHINITIS, SEASONAL 05/28/2006  . Major depressive disorder, recurrent episode (HCC) 05/05/2006  . MIGRAINE, UNSPEC., W/O INTRACTABLE MIGRAINE 05/05/2006    Past Surgical History:  Procedure Laterality Date  . APPENDECTOMY    . CARDIAC SURGERY     cardiac ablations  . DILATION AND CURETTAGE OF UTERUS    . GANGLION CYST EXCISION     right wrist  . HERNIA REPAIR    . OVARIAN CYST REMOVAL    . SHOULDER SURGERY     right   . TONSILLECTOMY      OB History    Gravida  1   Para      Term      Preterm      AB      Living        SAB      TAB      Ectopic      Multiple      Live Births               Home Medications    Prior to Admission medications   Medication Sig Start Date End Date Taking? Authorizing Provider  DULoxetine (CYMBALTA) 30 MG capsule TAKE 1 CAPSULE BY MOUTH ONCE DAILY 08/30/18  Yes Thresa Ross, MD  DULoxetine (CYMBALTA) 60 MG capsule TAKE  1 CAPSULE BY MOUTH ONCE DAILY 08/30/18  Yes Thresa Ross, MD  levothyroxine (SYNTHROID, LEVOTHROID) 25 MCG tablet Take by mouth.   Yes [provider]  loratadine (CLARITIN) 10 MG tablet Take 10 mg by mouth daily.   Yes [provider]  montelukast (SINGULAIR) 10 MG tablet Take 10 mg by mouth at bedtime.   Yes [provider]  albuterol (PROVENTIL HFA;VENTOLIN HFA) 108 (90 BASE) MCG/ACT inhaler Inhale 2 puffs into the lungs every 4 (four) hours as needed for wheezing or shortness of breath. 10/21/14   Lattie Haw, MD  aspirin EC 81 MG tablet Take by mouth.    [provider]  benzonatate (TESSALON) 200 MG capsule Take one cap by mouth at bedtime as needed for cough.  May repeat in 4 to 6 hours 10/05/18   Lattie Haw, MD  buPROPion [facility] SR) 200 MG 12 hr tablet TAKE 1 TABLET BY MOUTH ONCE DAILY 08/30/18   Thresa Ross, MD  doxycycline (VIBRAMYCIN) 100 MG capsule Take 1 capsule (  100 mg total) by mouth 2 (two) times daily. Take with food. 10/05/18   Lattie Haw, MD  etanercept (ENBREL SURECLICK) 50 MG/ML injection INJECT ONE pen SUBCUTANEOUSLY EVERY WEEK 06/16/17   [provider]  fluconazole (DIFLUCAN) 150 MG tablet Take one tab by mouth once for yeast infection.  May repeat in 72 hours. 10/05/18   Lattie Haw, MD  fluticasone (FLONASE) 50 MCG/ACT nasal spray Place into the nose. 05/06/18 05/06/19  [provider]  furosemide (LASIX) 20 MG tablet Take 20 mg by mouth.    [provider]  losartan (COZAAR) 25 MG tablet TAKE ONE TABLET BY MOUTH ONCE DAILY 12/25/16   [provider]  mometasone-formoterol (DULERA) 200-5 MCG/ACT AERO 2 puffs 2 (two) times daily.  07/10/16   [provider]  norethindrone-ethinyl estradiol (JUNEL FE,GILDESS FE,LOESTRIN FE) 1-20 MG-MCG tablet Take 1 tablet by mouth daily.    [provider]  ondansetron (ZOFRAN ODT) 4 MG disintegrating tablet Take 1 tablet (4 mg  total) by mouth every 8 (eight) hours as needed. 12/09/19   Lurene Shadow, PA-C  predniSONE (DELTASONE) 20 MG tablet Take one tab by mouth twice daily for 4 days, then one daily for 3 days. Take with food. 10/05/18   Lattie Haw, MD  ARIPiprazole (ABILIFY) 10 MG tablet Take 1 tablet (10 mg total) by mouth daily. 07/13/17 09/10/17  Thresa Ross, MD  clonazePAM (KLONOPIN) 0.5 MG tablet Take 1 tablet (0.5 mg total) by mouth daily as needed for anxiety. Can take half  At times one tablet for anxiety prn 09/10/17 11/23/17  Thresa Ross, MD    Family History Family History  Problem Relation Age of Onset  . Migraines Mother   . GER disease Mother   . Narcolepsy Mother   . Restless legs syndrome Mother     Social History Social History   Tobacco Use  . Smoking status: Never Smoker  . Smokeless tobacco: Never Used  Vaping Use  . Vaping Use: Never used  Substance Use Topics  . Alcohol use: No  . Drug use: No     Allergies   Bactrim, Biaxin [clarithromycin], Clarithromycin, Dilaudid [hydromorphone hcl], Hydromorphone hcl, Imitrex [sumatriptan], Nalbuphine, Penicillins, and Vancomycin  Review of Systems Review of Systems Pertinent negatives listed in HPI Physical Exam Triage Vital Signs ED Triage Vitals  Enc Vitals Group     BP 03/14/20 1952 (!) 141/95     Pulse Rate 03/14/20 1952 88     Resp 03/14/20 1952 17     Temp 03/14/20 1952 99.2 F (37.3 C)     Temp Source 03/14/20 1952 Oral     SpO2 03/14/20 1952 98 %     Weight 03/14/20 1957 199 lb (90.3 kg)     Height 03/14/20 1957 5\' 5"  (1.651 m)     Head Circumference --      Peak Flow --      Pain Score 03/14/20 1956 6     Pain Loc --      Pain Edu? --      Excl. in GC? --    No data found.  Updated Vital Signs BP (!) 141/95 (BP Location: Right Arm)   Pulse 88   Temp 99.2 F (37.3 C) (Oral)   Resp 17   Ht 5\' 5"  (1.651 m)   Wt 199 lb (90.3 kg)   SpO2 98%   BMI 33.12 kg/m   Visual Acuity Right Eye Distance:  Left Eye Distance:   Bilateral Distance:    Right Eye Near:   Left Eye Near:    Bilateral Near:     Physical Exam Constitutional:      Appearance: She is ill-appearing.  HENT:     Right Ear: Tympanic membrane normal.     Left Ear: Tympanic membrane normal.     Nose: Congestion present.     Mouth/Throat:     Pharynx: Uvula swelling present.  Cardiovascular:     Rate and Rhythm: Normal rate.  Pulmonary:     Breath sounds: No decreased air movement or transmitted upper airway sounds. Wheezing present.  Neurological:     Mental Status: She is alert.      UC Treatments / Results  Labs (all labs ordered are listed, but only abnormal results are displayed) Labs Reviewed - No data to display  EKG   Radiology No results found.  Procedures Procedures (including critical care time)  Medications Ordered in UC Medications - No data to display  Initial Impression / Assessment and Plan / UC Course  I have reviewed the triage vital signs and the nursing notes.  Pertinent labs & imaging results that were available during my care of the patient were reviewed by me and considered in my medical decision making (see chart for details).   COVID-19 test pending. atient encouraged to self isolate while test is pending.Will initiate treatment for acute bronchitis, see orders.Red flags discussed that warrant immediate evaluation in setting of the emergency department. Final Clinical Impressions(s) / UC Diagnoses   Final diagnoses:  Cough with exposure to COVID-19 virus  Acute pharyngitis, unspecified etiology     Discharge Instructions     Your COVID 19 results will be available in 24-48 hours. Negative results are immediately resulted to Mychart. Positive results will receive a follow-up call from our clinic. If symptoms are present, I recommend home quarantine until results are known.   Take all medications as prescribed. Resume use of your albuterol inhaler as needed for  shortness of breath.    ED Prescriptions    Medication Sig Dispense Auth. Provider   doxycycline (VIBRAMYCIN) 100 MG capsule Take 1 capsule (100 mg total) by mouth 2 (two) times daily. 20 capsule Bing Neighbors, FNP   lidocaine (XYLOCAINE) 2 % solution Use as directed 15 mLs in the mouth or throat as needed for mouth pain (mix with warm salt water and gargle and spit). 100 mL Bing Neighbors, FNP   predniSONE (DELTASONE) 20 MG tablet Take 2 tablets (40 mg total) by mouth daily for 5 days. 10 tablet Bing Neighbors, FNP   benzonatate (TESSALON) 200 MG capsule Take one cap by mouth at bedtime as needed for cough.  May repeat in 4 to 6 hours 15 capsule Bing Neighbors, FNP     PDMP not reviewed this encounter.   Bing Neighbors, FNP 03/16/20 2216

## 2020-03-14 NOTE — Discharge Instructions (Addendum)
Your COVID 19 results will be available in 24-48 hours. Negative results are immediately resulted to Mychart. Positive results will receive a follow-up call from our clinic. If symptoms are present, I recommend home quarantine until results are known.   Take all medications as prescribed. Resume use of your albuterol inhaler as needed for shortness of breath.

## 2020-03-17 LAB — NOVEL CORONAVIRUS, NAA: SARS-CoV-2, NAA: NOT DETECTED

## 2020-03-31 ENCOUNTER — Emergency Department (INDEPENDENT_AMBULATORY_CARE_PROVIDER_SITE_OTHER): Payer: BLUE CROSS/BLUE SHIELD

## 2020-03-31 ENCOUNTER — Other Ambulatory Visit: Payer: Self-pay

## 2020-03-31 ENCOUNTER — Emergency Department (INDEPENDENT_AMBULATORY_CARE_PROVIDER_SITE_OTHER)
Admission: EM | Admit: 2020-03-31 | Discharge: 2020-03-31 | Disposition: A | Discharge status: Home / Self Care | Payer: BLUE CROSS/BLUE SHIELD | Source: Home / Self Care

## 2020-03-31 DIAGNOSIS — R590 Localized enlarged lymph nodes: Secondary | ICD-10-CM | POA: Diagnosis not present

## 2020-03-31 DIAGNOSIS — R062 Wheezing: Secondary | ICD-10-CM | POA: Diagnosis not present

## 2020-03-31 DIAGNOSIS — R509 Fever, unspecified: Secondary | ICD-10-CM

## 2020-03-31 DIAGNOSIS — R52 Pain, unspecified: Secondary | ICD-10-CM | POA: Diagnosis not present

## 2020-03-31 DIAGNOSIS — T50Z95A Adverse effect of other vaccines and biological substances, initial encounter: Secondary | ICD-10-CM

## 2020-03-31 MED ORDER — ACETAMINOPHEN 325 MG PO TABS
650.0000 mg | ORAL_TABLET | Freq: Once | ORAL | Status: AC
  Administered 2020-03-31: 650 mg via ORAL

## 2020-03-31 NOTE — ED Triage Notes (Signed)
Pt c/o LT upper arm pain and swelling since Thurs evening after getting first covid vaccine. ([employer]) Hot to touch and swelling is going down into axilla area. Bodyaches all over, fatigue, and chills. Methocarbamol and ibuprofen last night.

## 2020-03-31 NOTE — ED Provider Notes (Signed)
[facility]    CSN: [id number] Arrival date & time: 03/31/20  1327      History   Chief Complaint Chief Complaint  Patient presents with  . Arm Pain    LT, swelling from covid vaccine    HPI Brenda Burton is a 49 y.o. female.   HPI  Brenda Burton is a 49 y.o. female presenting to UC with c/o Left arm redness, pain and swelling at location of [employer] vaccine received 2 days ago. Associated generalized body aches, fatigue, pain and swelling in Left axilla. Chills but no fever. Pt took her methocarbamol and ibuprofen last night without relief. Pt had COVID in October 2020.  Hx of asthma. She has not used her inhaler more recently. She notes her 7yo son has URI symptoms, tested negative for COVID earlier in the week.  Denies n/v/d. No chest pain or SOB at this time.    Past Medical History:  Diagnosis Date  . ADHD (attention deficit hyperactivity disorder)   . Asthma   . Depression   . Hypertension   . Migraine   . Psoriatic arthritis (HCC)   . Seasonal allergies   . Thyroid disease     Patient Active Problem List   Diagnosis Date Noted  . 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 History of 2019 novel coronavirus disease (COVID-19) 09/13/2019  . IFG (impaired fasting glucose) 04/05/2019  . Mild persistent asthma without complication 12/14/2018  . Acute bacterial bronchitis 10/28/2018  . Oral thrush 06/08/2018  . Allergic rhinitis 05/07/2018  . Exacerbation of asthma 05/07/2018  . Gastro-esophageal reflux disease without esophagitis 05/07/2018  . Long-term use of high-risk medication 12/09/2016  . Essential hypertension 06/23/2016  . Palpitations 06/10/2016  . Right foot pain 02/22/2016  . Left knee pain 02/22/2016  . Hyperlipidemia 03/28/2015  . ADHD (attention deficit hyperactivity disorder) 03/28/2011  . ADHD 10/21/2006  . TMJ PAIN 10/21/2006  . COUGH 08/05/2006  . ALLERGIC RHINITIS, SEASONAL 05/28/2006  . Major depressive  disorder, recurrent episode (HCC) 05/05/2006  . MIGRAINE, UNSPEC., W/O INTRACTABLE MIGRAINE 05/05/2006    Past Surgical History:  Procedure Laterality Date  . APPENDECTOMY    . CARDIAC SURGERY     cardiac ablations  . DILATION AND CURETTAGE OF UTERUS    . GANGLION CYST EXCISION     right wrist  . HERNIA REPAIR    . OVARIAN CYST REMOVAL    . SHOULDER SURGERY     right   . TONSILLECTOMY      OB History    Gravida  1   Para      Term      Preterm      AB      Living        SAB      TAB      Ectopic      Multiple      Live Births               Home Medications    Prior to Admission medications   Medication Sig Start Date End Date Taking? Authorizing Provider  albuterol (PROVENTIL HFA;VENTOLIN HFA) 108 (90 BASE) MCG/ACT inhaler Inhale 2 puffs into the lungs every 4 (four) hours as needed for wheezing or shortness of breath. 10/21/14   Lattie Haw, MD  aspirin 325 MG EC tablet Take by mouth. 08/05/19   [provider]  aspirin EC 81 MG tablet Take by mouth.  [provider]  benzonatate (TESSALON) 200 MG capsule Take one cap by mouth at bedtime as needed for cough.  May repeat in 4 to 6 hours 03/14/20   Bing Neighbors, FNP  buPROPion [facility] SR) 200 MG 12 hr tablet TAKE 1 TABLET BY MOUTH ONCE DAILY 08/30/18   Thresa Ross, MD  clonazePAM (KLONOPIN) 0.5 MG tablet Take 0.5 mg by mouth daily as needed. 02/21/20   [provider]  diphenhydrAMINE (SOMINEX) 25 MG tablet Take by mouth.    [provider]  doxycycline (VIBRAMYCIN) 100 MG capsule Take 1 capsule (100 mg total) by mouth 2 (two) times daily. 03/14/20   Bing Neighbors, FNP  DULoxetine (CYMBALTA) 30 MG capsule TAKE 1 CAPSULE BY MOUTH ONCE DAILY 08/30/18   Thresa Ross, MD  DULoxetine (CYMBALTA) 60 MG capsule TAKE 1 CAPSULE BY MOUTH ONCE DAILY 08/30/18   Thresa Ross, MD  etanercept (ENBREL SURECLICK) 50 MG/ML injection INJECT ONE pen SUBCUTANEOUSLY EVERY  WEEK 06/16/17   [provider]  fluconazole (DIFLUCAN) 150 MG tablet Take one tab by mouth once for yeast infection.  May repeat in 72 hours. 10/05/18   Lattie Haw, MD  fluticasone (FLONASE) 50 MCG/ACT nasal spray Place into the nose. 05/06/18 05/06/19  [provider]  furosemide (LASIX) 20 MG tablet Take 20 mg by mouth.    [provider]  gabapentin (NEURONTIN) 100 MG capsule Take 100 mg by mouth 3 (three) times daily. 03/12/20   [provider]  hydrOXYzine (ATARAX/VISTARIL) 25 MG tablet Take 1 tablet by mouth 3 (three) times daily as needed. 02/23/20   [provider]  levothyroxine (SYNTHROID, LEVOTHROID) 25 MCG tablet Take by mouth.    [provider]  lidocaine (XYLOCAINE) 2 % solution Use as directed 15 mLs in the mouth or throat as needed for mouth pain (mix with warm salt water and gargle and spit). 03/14/20   Bing Neighbors, FNP  loratadine (CLARITIN) 10 MG tablet Take 10 mg by mouth daily.    [provider]  losartan (COZAAR) 25 MG tablet TAKE ONE TABLET BY MOUTH ONCE DAILY 12/25/16   [provider]  methocarbamol (ROBAXIN) 750 MG tablet TAKE 1 TABLET BY MOUTH 4 TIMES DAILY FOR 10 DAYS 09/07/18   [provider]  mometasone-formoterol (DULERA) 200-5 MCG/ACT AERO 2 puffs 2 (two) times daily.  07/10/16   [provider]  montelukast (SINGULAIR) 10 MG tablet Take 10 mg by mouth at bedtime.    [provider]  Multiple Vitamins-Minerals (MULTIVITAMIN WITH MINERALS) tablet Take 1 tablet by mouth daily.    [provider]  norethindrone-ethinyl estradiol (JUNEL FE,GILDESS FE,LOESTRIN FE) 1-20 MG-MCG tablet Take 1 tablet by mouth daily.    [provider]  Norethindrone-Ethinyl Estradiol-Fe (GENERESSE) 0.8-25 MG-MCG tablet TAKE 1  BY MOUTH ONCE DAILY 11/07/19   [provider]  omeprazole (PRILOSEC) 40 MG capsule Take by mouth. 05/24/19   [provider]    ondansetron (ZOFRAN ODT) 4 MG disintegrating tablet Take 1 tablet (4 mg total) by mouth every 8 (eight) hours as needed. 12/09/19   Lurene Shadow, PA-C  ARIPiprazole (ABILIFY) 10 MG tablet Take 1 tablet (10 mg total) by mouth daily. 07/13/17 09/10/17  Thresa Ross, MD    Family History Family History  Problem Relation Age of Onset  . Migraines Mother   . GER disease Mother   . Narcolepsy Mother   . Restless legs syndrome Mother   . COPD Father  Social History Social History   Tobacco Use  . Smoking status: Never Smoker  . Smokeless tobacco: Never Used  Vaping Use  . Vaping Use: Never used  Substance Use Topics  . Alcohol use: No  . Drug use: No     Allergies   Bactrim, Biaxin [clarithromycin], Clarithromycin, Dilaudid [hydromorphone hcl], Hydromorphone hcl, Imitrex [sumatriptan], Nalbuphine, Penicillins, and Vancomycin   Review of Systems Review of Systems  Constitutional: Positive for chills and fatigue. Negative for fever.  HENT: Negative for congestion, ear pain, sore throat, trouble swallowing and voice change.   Respiratory: Negative for cough and shortness of breath.   Cardiovascular: Negative for chest pain and palpitations.  Gastrointestinal: Negative for abdominal pain, diarrhea, nausea and vomiting.  Musculoskeletal: Positive for arthralgias and myalgias. Negative for back pain.  Skin: Positive for color change. Negative for rash.  Neurological: Positive for weakness (generalized).  All other systems reviewed and are negative.    Physical Exam Triage Vital Signs ED Triage Vitals  Enc Vitals Group     BP 03/31/20 1413 (!) 148/100     Pulse Rate 03/31/20 1413 (!) 101     Resp 03/31/20 1413 19     Temp 03/31/20 1413 99.3 F (37.4 C)     Temp Source 03/31/20 1413 Oral     SpO2 03/31/20 1413 99 %     Weight --      Height --      Head Circumference --      Peak Flow --      Pain Score 03/31/20 1408 8     Pain Loc --      Pain Edu? --       Excl. in GC? --    No data found.  Updated Vital Signs BP (!) 148/100 (BP Location: Right Arm)   Pulse (!) 101   Temp 99.3 F (37.4 C) (Oral)   Resp 19   LMP 03/06/2020 (Exact Date)   SpO2 99%   Visual Acuity Right Eye Distance:   Left Eye Distance:   Bilateral Distance:    Right Eye Near:   Left Eye Near:    Bilateral Near:     Physical Exam Vitals and nursing note reviewed.  Constitutional:      General: She is not in acute distress.    Appearance: Normal appearance. She is well-developed. She is not ill-appearing, toxic-appearing or diaphoretic.  HENT:     Head: Normocephalic and atraumatic.     Right Ear: Tympanic membrane, ear canal and external ear normal.     Left Ear: Tympanic membrane, ear canal and external ear normal.     Nose: Nose normal.     Right Sinus: No maxillary sinus tenderness or frontal sinus tenderness.     Left Sinus: No maxillary sinus tenderness or frontal sinus tenderness.     Mouth/Throat:     Lips: Pink.     Mouth: Mucous membranes are moist.     Pharynx: Oropharynx is clear. Uvula midline.  Cardiovascular:     Rate and Rhythm: Normal rate and regular rhythm.  Pulmonary:     Effort: Pulmonary effort is normal. No respiratory distress.     Breath sounds: No stridor. Wheezing (faint, diffuse) present. No rhonchi or rales.  Musculoskeletal:        General: Tenderness present. Normal range of motion.     Cervical back: Normal range of motion.  Skin:    General: Skin is warm and dry.  Findings: Erythema present.       Neurological:     Mental Status: She is alert and oriented to person, place, and time.  Psychiatric:        Behavior: Behavior normal.      UC Treatments / Results  Labs (all labs ordered are listed, but only abnormal results are displayed) Labs Reviewed - No data to display  EKG   Radiology DG Chest 2 View  Result Date: 03/31/2020 CLINICAL DATA:  Body aches with chills, fever and wheezing. EXAM: CHEST - 2  VIEW COMPARISON:  Radiographs 12/09/2019.  CT 09/17/2019. FINDINGS: The heart size and mediastinal contours are normal. The lungs are clear. There is no pleural effusion or pneumothorax. No acute osseous findings are identified. Loop recorder again noted within the left anterior chest soft tissues. There are postsurgical changes consistent with previous distal right clavicle resection. IMPRESSION: Stable chest.  No active cardiopulmonary process. Electronically Signed   By: Carey BullocksWilliam  Veazey M.D.   On: 03/31/2020 15:02    Procedures Procedures (including critical care time)  Medications Ordered in UC Medications  acetaminophen (TYLENOL) tablet 650 mg (650 mg Oral Given 03/31/20 1418)    Initial Impression / Assessment and Plan / UC Course  I have reviewed the triage vital signs and the nursing notes.  Pertinent labs & imaging results that were available during my care of the patient were reviewed by me and considered in my medical decision making (see chart for details).     Redness, tenderness and warmth at site of COVID-19 injection Faint diffuse wheeze on exam Left axillary lymphadenopathy Reassured pt of unchanged CXR Encouraged close f/u with PCP and to discuss if 2nd dose is right for her, if precautions can be taking prior to second dose to limit side effects Discussed symptoms that warrant emergent care in the ED. AVS given   Final Clinical Impressions(s) / UC Diagnoses   Final diagnoses:  Vaccine reaction, initial encounter  Body aches  Axillary lymphadenopathy  Wheeze     Discharge Instructions      You may take 500mg  acetaminophen every 4-6 hours or in combination with ibuprofen 400-600mg  every 6-8 hours as needed for pain, inflammation, and fever.  You should apply a cool compress to your warm 3-4 times daily for 10-15 minutes at a time to help with pain and inflammation.  Be sure to well hydrated with clear liquids and get at least 8 hours of sleep at night,  preferably more while sick.   Please follow up with family medicine this week for recheck of symptoms if not improving.   Call 911 or have someone drive you to the hospital if symptoms significantly worsening- including if you develop chest pain, trouble breathing, unable to keep down fluids, dizziness/passing out, or other new concerning symptoms develop.     ED Prescriptions    None     PDMP not reviewed this encounter.   Lurene Shadowhelps, Deloros Beretta O, [place]-C 03/31/20 [phone]

## 2020-03-31 NOTE — Discharge Instructions (Addendum)
  You may take 500mg  acetaminophen every 4-6 hours or in combination with ibuprofen 400-600mg  every 6-8 hours as needed for pain, inflammation, and fever.  You should apply a cool compress to your warm 3-4 times daily for 10-15 minutes at a time to help with pain and inflammation.  Be sure to well hydrated with clear liquids and get at least 8 hours of sleep at night, preferably more while sick.   Please follow up with family medicine this week for recheck of symptoms if not improving.   Call 911 or have someone drive you to the hospital if symptoms significantly worsening- including if you develop chest pain, trouble breathing, unable to keep down fluids, dizziness/passing out, or other new concerning symptoms develop.

## 2020-05-02 ENCOUNTER — Emergency Department: Admit: 2020-05-02 | Discharge status: Absent without leave | Payer: Self-pay

## 2020-05-02 ENCOUNTER — Emergency Department (INDEPENDENT_AMBULATORY_CARE_PROVIDER_SITE_OTHER)
Admission: EM | Admit: 2020-05-02 | Discharge: 2020-05-02 | Disposition: A | Discharge status: Home / Self Care | Payer: BLUE CROSS/BLUE SHIELD | Source: Home / Self Care

## 2020-05-02 ENCOUNTER — Other Ambulatory Visit: Payer: Self-pay

## 2020-05-02 ENCOUNTER — Emergency Department (INDEPENDENT_AMBULATORY_CARE_PROVIDER_SITE_OTHER): Payer: BLUE CROSS/BLUE SHIELD

## 2020-05-02 DIAGNOSIS — W19XXXA Unspecified fall, initial encounter: Secondary | ICD-10-CM | POA: Diagnosis not present

## 2020-05-02 DIAGNOSIS — M5431 Sciatica, right side: Secondary | ICD-10-CM

## 2020-05-02 DIAGNOSIS — M25532 Pain in left wrist: Secondary | ICD-10-CM

## 2020-05-02 DIAGNOSIS — M5432 Sciatica, left side: Secondary | ICD-10-CM

## 2020-05-02 MED ORDER — NAPROXEN 500 MG PO TABS: 500.0000 mg | ORAL_TABLET | Freq: Two times a day (BID) | ORAL | 0 refills | Status: AC

## 2020-05-02 MED ORDER — KETOROLAC TROMETHAMINE 30 MG/ML IJ SOLN
30.0000 mg | Freq: Once | INTRAMUSCULAR | Status: AC
  Administered 2020-05-02: 30 mg via INTRAMUSCULAR

## 2020-05-02 NOTE — Discharge Instructions (Addendum)
Wear brace until left arm pain improves.  Follow-up with your orthopedic provider as needed . No fracture to the wrist per x-ray.

## 2020-05-02 NOTE — ED Triage Notes (Signed)
Pt c/o generalized bodyaches since getting second covid shot yesterday. Also having LT wrist pain and lower back pain since falling yesterday night in the yard. LT wrist surgery March 2021. Motrin prn

## 2020-05-02 NOTE — ED Notes (Signed)
Pt states she does not need a wrist brace which was ordered by K. Harris, NP--states she already has one.

## 2020-05-02 NOTE — ED Provider Notes (Addendum)
[facility]    CSN: [id number] Arrival date & time: 05/02/20  1710      History   Chief Complaint Chief Complaint  Patient presents with  . Back Pain    lower  . Generalized Body Aches  . Wrist Pain    LT    HPI Brenda Burton is a 49 y.o. female.   HPI  Fall yesterday and landed on wrist (left) and injured back.  Patient also has a history of chronic sciatica.  She reports when falling on yesterday this is caused her sciatica type flare.  She is having shooting pains bilaterally into her buttocks.  Denies lower extremity weakness.  She is ambulatory.  Patient is followed by physical medicine for multiple chronic conditions related to MSK pain and chronic arthritis.  Patient also complains of generalized body aches without fever.  Patient is status post second Covid 19 vaccine on yesterday.  She is without fever.  Reports falling as a result of tripping over something in her yard.  She did not hit her head or lose consciousness.    Past Medical History:  Diagnosis Date  . ADHD (attention deficit hyperactivity disorder)   . Asthma   . Depression   . Hypertension   . Migraine   . Psoriatic arthritis (HCC)   . Seasonal allergies   . Thyroid disease     Patient Active Problem List   Diagnosis Date Noted  . 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 History of 2019 novel coronavirus disease (COVID-19) 09/13/2019  . IFG (impaired fasting glucose) 04/05/2019  . Mild persistent asthma without complication 12/14/2018  . Acute bacterial bronchitis 10/28/2018  . Oral thrush 06/08/2018  . Allergic rhinitis 05/07/2018  . Exacerbation of asthma 05/07/2018  . Gastro-esophageal reflux disease without esophagitis 05/07/2018  . Long-term use of high-risk medication 12/09/2016  . Essential hypertension 06/23/2016  . Palpitations 06/10/2016  . Right foot pain 02/22/2016  . Left knee pain 02/22/2016  . Hyperlipidemia 03/28/2015  . ADHD (attention deficit  hyperactivity disorder) 03/28/2011  . ADHD 10/21/2006  . TMJ PAIN 10/21/2006  . COUGH 08/05/2006  . ALLERGIC RHINITIS, SEASONAL 05/28/2006  . Major depressive disorder, recurrent episode (HCC) 05/05/2006  . MIGRAINE, UNSPEC., W/O INTRACTABLE MIGRAINE 05/05/2006    Past Surgical History:  Procedure Laterality Date  . APPENDECTOMY    . CARDIAC SURGERY     cardiac ablations  . DILATION AND CURETTAGE OF UTERUS    . GANGLION CYST EXCISION     right wrist  . HERNIA REPAIR    . OVARIAN CYST REMOVAL    . SHOULDER SURGERY     right   . TONSILLECTOMY      OB History    Gravida  1   Para      Term      Preterm      AB      Living        SAB      TAB      Ectopic      Multiple      Live Births               Home Medications    Prior to Admission medications   Medication Sig Start Date End Date Taking? Authorizing Provider  albuterol (PROVENTIL HFA;VENTOLIN HFA) 108 (90 BASE) MCG/ACT inhaler Inhale 2 puffs into the lungs every 4 (four) hours as needed for wheezing or shortness of breath. 10/21/14   Beese,  Tera Mater, MD  aspirin 325 MG EC tablet Take by mouth. 08/05/19   [provider]  aspirin EC 81 MG tablet Take by mouth.    [provider]  benzonatate (TESSALON) 200 MG capsule Take one cap by mouth at bedtime as needed for cough.  May repeat in 4 to 6 hours 03/14/20   Bing Neighbors, FNP  buPROPion [facility] SR) 200 MG 12 hr tablet TAKE 1 TABLET BY MOUTH ONCE DAILY 08/30/18   Thresa Ross, MD  clonazePAM (KLONOPIN) 0.5 MG tablet Take 0.5 mg by mouth daily as needed. 02/21/20   [provider]  diphenhydrAMINE (SOMINEX) 25 MG tablet Take by mouth.    [provider]  doxycycline (VIBRAMYCIN) 100 MG capsule Take 1 capsule (100 mg total) by mouth 2 (two) times daily. 03/14/20   Bing Neighbors, FNP  DULoxetine (CYMBALTA) 30 MG capsule TAKE 1 CAPSULE BY MOUTH ONCE DAILY 08/30/18   Thresa Ross, MD  DULoxetine (CYMBALTA) 60  MG capsule TAKE 1 CAPSULE BY MOUTH ONCE DAILY 08/30/18   Thresa Ross, MD  etanercept (ENBREL SURECLICK) 50 MG/ML injection INJECT ONE pen SUBCUTANEOUSLY EVERY WEEK 06/16/17   [provider]  fluconazole (DIFLUCAN) 150 MG tablet Take one tab by mouth once for yeast infection.  May repeat in 72 hours. 10/05/18   Lattie Haw, MD  fluticasone (FLONASE) 50 MCG/ACT nasal spray Place into the nose. 05/06/18 05/06/19  [provider]  furosemide (LASIX) 20 MG tablet Take 20 mg by mouth.    [provider]  gabapentin (NEURONTIN) 100 MG capsule Take 100 mg by mouth 3 (three) times daily. 03/12/20   [provider]  hydrOXYzine (ATARAX/VISTARIL) 25 MG tablet Take 1 tablet by mouth 3 (three) times daily as needed. 02/23/20   [provider]  levothyroxine (SYNTHROID, LEVOTHROID) 25 MCG tablet Take by mouth.    [provider]  lidocaine (XYLOCAINE) 2 % solution Use as directed 15 mLs in the mouth or throat as needed for mouth pain (mix with warm salt water and gargle and spit). 03/14/20   Bing Neighbors, FNP  loratadine (CLARITIN) 10 MG tablet Take 10 mg by mouth daily.    [provider]  losartan (COZAAR) 25 MG tablet TAKE ONE TABLET BY MOUTH ONCE DAILY 12/25/16   [provider]  methocarbamol (ROBAXIN) 750 MG tablet TAKE 1 TABLET BY MOUTH 4 TIMES DAILY FOR 10 DAYS 09/07/18   [provider]  mometasone-formoterol (DULERA) 200-5 MCG/ACT AERO 2 puffs 2 (two) times daily.  07/10/16   [provider]  montelukast (SINGULAIR) 10 MG tablet Take 10 mg by mouth at bedtime.    [provider]  Multiple Vitamins-Minerals (MULTIVITAMIN WITH MINERALS) tablet Take 1 tablet by mouth daily.    [provider]  norethindrone-ethinyl estradiol (JUNEL FE,GILDESS FE,LOESTRIN FE) 1-20 MG-MCG tablet Take 1 tablet by mouth daily.    [provider]  Norethindrone-Ethinyl Estradiol-Fe (GENERESSE) 0.8-25 MG-MCG  tablet TAKE 1  BY MOUTH ONCE DAILY 11/07/19   [provider]  omeprazole (PRILOSEC) 40 MG capsule Take by mouth. 05/24/19   [provider]  ondansetron (ZOFRAN ODT) 4 MG disintegrating tablet Take 1 tablet (4 mg total) by mouth every 8 (eight) hours as needed. 12/09/19   Lurene Shadow, PA-C  ARIPiprazole (ABILIFY) 10 MG tablet Take 1 tablet (10 mg total) by mouth daily. 07/13/17 09/10/17  Thresa Ross, MD    Family History Family History  Problem Relation Age  of Onset  . Migraines Mother   . GER disease Mother   . Narcolepsy Mother   . Restless legs syndrome Mother   . COPD Father     Social History Social History   Tobacco Use  . Smoking status: Never Smoker  . Smokeless tobacco: Never Used  Vaping Use  . Vaping Use: Never used  Substance Use Topics  . Alcohol use: No  . Drug use: No     Allergies   Bactrim, Biaxin [clarithromycin], Clarithromycin, Dilaudid [hydromorphone hcl], Hydromorphone hcl, Imitrex [sumatriptan], Nalbuphine, Penicillins, and Vancomycin   Review of Systems Review of Systems Pertinent negatives listed in HPI  Physical Exam Triage Vital Signs ED Triage Vitals  Enc Vitals Group     BP 05/02/20 1728 138/87     Pulse Rate 05/02/20 1728 (!) 104     Resp 05/02/20 1728 18     Temp 05/02/20 1728 98.9 F (37.2 C)     Temp Source 05/02/20 1728 Oral     SpO2 05/02/20 1728 97 %     Weight --      Height --      Head Circumference --      Peak Flow --      Pain Score 05/02/20 1730 6     Pain Loc --      Pain Edu? --      Excl. in GC? --    No data found.  Updated Vital Signs BP 138/87 (BP Location: Right Arm)   Pulse (!) 104   Temp 98.9 F (37.2 C) (Oral)   Resp 18   LMP 04/07/2020   SpO2 97%   Visual Acuity Right Eye Distance:   Left Eye Distance:   Bilateral Distance:    Right Eye Near:   Left Eye Near:    Bilateral Near:     Physical Exam Constitutional:      Appearance: She is obese.  HENT:     Head:  Normocephalic.  Musculoskeletal:     Left wrist: Bony tenderness present. No swelling. Decreased range of motion.     Lumbar back: Tenderness present. Positive right straight leg raise test and positive left straight leg raise test.  Neurological:     General: No focal deficit present.     Mental Status: She is alert.     GCS: GCS eye subscore is 4. GCS verbal subscore is 5. GCS motor subscore is 6.  Psychiatric:        Attention and Perception: Attention and perception normal.        Mood and Affect: Mood normal.      UC Treatments / Results  Labs (all labs ordered are listed, but only abnormal results are displayed) Labs Reviewed - No data to display  EKG   Radiology DG Wrist Complete Left  Result Date: 05/02/2020 CLINICAL DATA:  Fall.  Radial wrist pain. EXAM: LEFT WRIST - COMPLETE 3+ VIEW COMPARISON:  Bilateral hand x-rays dated April 18, 2020. FINDINGS: No acute fracture or dislocation. Prior trapeziectomy. Joint spaces are preserved. Soft tissues are unremarkable. IMPRESSION: 1. No acute osseous abnormality. Electronically Signed   By: Obie Dredge M.D.   On: 05/02/2020 18:44    Procedures Procedures (including critical care time)  Medications Ordered in UC Medications - No data to display  Initial Impression / Assessment and Plan / UC Course  I have reviewed the triage vital signs and the nursing notes.  Pertinent labs & imaging results that were available during  my care of the patient were reviewed by me and considered in my medical decision making (see chart for details).    Left wrist imaging negative.  Patient has a wrist splint that she is to wear routinely as she is status post left wrist surgery in March 2021.  She reports having the splint at home advised to resume wearing on a daily basis per orthopedic orders.  Advised to start naproxen as needed for pain.  Follow-up with physical medicine orthopedic as needed.  Refrain from prescribing any additional  pain medications as patient is on several sedating type medications.  Patient verbalized understanding and agreement with plan.  Final Clinical Impressions(s) / UC Diagnoses   Final diagnoses:  Left wrist pain  Fall, initial encounter  Bilateral sciatica     Discharge Instructions     Wear brace until left arm pain improves.  Follow-up with your orthopedic provider as needed . No fracture to the wrist per x-ray.    ED Prescriptions    Medication Sig Dispense Auth. Provider   naproxen (NAPROSYN) 500 MG tablet Take 1 tablet (500 mg total) by mouth 2 (two) times daily with a meal. 30 tablet Bing NeighborsHarris, Davaris Youtsey S, FNP     PDMP not reviewed this encounter.   Bing NeighborsHarris, Iaan Oregel S, FNP 05/04/20 0820    Bing NeighborsHarris, Hayden Mabin S, FNP 05/04/20 [phone]

## 2020-05-15 ENCOUNTER — Encounter: Payer: Self-pay | Admitting: Emergency Medicine

## 2020-05-15 ENCOUNTER — Other Ambulatory Visit: Payer: Self-pay

## 2020-05-15 ENCOUNTER — Emergency Department (INDEPENDENT_AMBULATORY_CARE_PROVIDER_SITE_OTHER)
Admission: EM | Admit: 2020-05-15 | Discharge: 2020-05-15 | Disposition: A | Discharge status: Home / Self Care | Payer: BLUE CROSS/BLUE SHIELD | Source: Home / Self Care

## 2020-05-15 ENCOUNTER — Emergency Department: Admit: 2020-05-15 | Discharge status: Absent without leave | Payer: Self-pay

## 2020-05-15 DIAGNOSIS — R110 Nausea: Secondary | ICD-10-CM

## 2020-05-15 DIAGNOSIS — R5383 Other fatigue: Secondary | ICD-10-CM

## 2020-05-15 DIAGNOSIS — R197 Diarrhea, unspecified: Secondary | ICD-10-CM | POA: Diagnosis not present

## 2020-05-15 MED ORDER — METOCLOPRAMIDE HCL 10 MG PO TABS: 10.0000 mg | ORAL_TABLET | Freq: Four times a day (QID) | ORAL | 0 refills | Status: DC

## 2020-05-15 NOTE — ED Triage Notes (Signed)
Pt here today for continued diarrhea  Seen in ER last Tuesday  Combo flu/RSV/Covid test was negative  No follow up w/ PCP Diarrhea today (10+ per pt) No stool specimen done in ER per pt Here today for continued general malaise

## 2020-05-15 NOTE — ED Provider Notes (Signed)
[facility]    CSN: [id number] Arrival date & time: 05/15/20  1702      History   Chief Complaint Chief Complaint  Patient presents with  . Diarrhea  . Fatigue    HPI Brenda Burton is a 49 y.o. female.   HPI  Patient presents today reporting she is here for follow-up from a recent ER visit.  Patient [facility] care provider [facility] and has not followed up since last visit at ER.  Patient was seen in ER for fever, fatigue, persistent diarrhea.  She had a complete work-up including a viral panel which was negative for Covid, RSV, flu. She also had a urinalysis which was insignificant for UTI. Reports over 6-8 loose stools per day for over 1 week.  Reports multiple stools today. Endorses nausea without vomiting. Patient had a visit with her PCP yesterday and did not address any other concerns she presents here with plan.  She reports that she has had fever and continued fatigue.  Although her last menstrual period fever she endorses close about 4 days ago.  Patient has a history of Covid and endorses Covid long-haul syndrome.  She has been trying to get established with a Covid long-haul [place] clinic [place] during one of her recent visit she was given the information to get established with Covid long-haul [place] however has not contacted their office.  Past Medical History:  Diagnosis Date  . ADHD (attention deficit hyperactivity disorder)   . Asthma   . Depression   . Hypertension   . Migraine   . Psoriatic arthritis (HCC)   . Seasonal allergies   . Thyroid disease     Patient Active Problem List   Diagnosis Date Noted  . 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 History of 2019 novel coronavirus disease (COVID-19) 09/13/2019  . IFG (impaired fasting glucose) 04/05/2019  . Mild persistent asthma without complication 12/14/2018  . Acute bacterial bronchitis 10/28/2018  . Oral thrush 06/08/2018  . Allergic  rhinitis 05/07/2018  . Exacerbation of asthma 05/07/2018  . Gastro-esophageal reflux disease without esophagitis 05/07/2018  . Long-term use of high-risk medication 12/09/2016  . Essential hypertension 06/23/2016  . Palpitations 06/10/2016  . Right foot pain 02/22/2016  . Left knee pain 02/22/2016  . Hyperlipidemia 03/28/2015  . ADHD (attention deficit hyperactivity disorder) 03/28/2011  . ADHD 10/21/2006  . TMJ PAIN 10/21/2006  . COUGH 08/05/2006  . ALLERGIC RHINITIS, SEASONAL 05/28/2006  . Major depressive disorder, recurrent episode (HCC) 05/05/2006  . MIGRAINE, UNSPEC., W/O INTRACTABLE MIGRAINE 05/05/2006    Past Surgical History:  Procedure Laterality Date  . APPENDECTOMY    . CARDIAC SURGERY     cardiac ablations  . DILATION AND CURETTAGE OF UTERUS    . GANGLION CYST EXCISION     right wrist  . HERNIA REPAIR    . OVARIAN CYST REMOVAL    . SHOULDER SURGERY     right   . TONSILLECTOMY      OB History    Gravida  1   Para      Term      Preterm      AB      Living        SAB      TAB      Ectopic      Multiple      Live Births  Home Medications    Prior to Admission medications   Medication Sig Start Date End Date Taking? Authorizing Provider  albuterol (PROVENTIL HFA;VENTOLIN HFA) 108 (90 BASE) MCG/ACT inhaler Inhale 2 puffs into the lungs every 4 (four) hours as needed for wheezing or shortness of breath. 10/21/14  Yes Lattie Haw, MD  aspirin 325 MG EC tablet Take by mouth. 08/05/19  Yes [provider]  clonazePAM (KLONOPIN) 0.5 MG tablet Take 0.5 mg by mouth daily as needed. 02/21/20  Yes [provider]  Doxepin HCl 6 MG TABS Take by mouth. 05/08/20  Yes [provider]  DULoxetine (CYMBALTA) 60 MG capsule TAKE 1 CAPSULE BY MOUTH ONCE DAILY Patient taking differently: 120 mg. ** Do NOT chew, crush, or open capsule ** 08/30/18  Yes Thresa Ross, MD  fluticasone (FLOVENT HFA) 110 MCG/ACT inhaler  Inhale into the lungs. 04/11/19  Yes [provider]  hydrOXYzine (ATARAX/VISTARIL) 25 MG tablet Take 1 tablet by mouth 3 (three) times daily as needed. 02/23/20  Yes [provider]  ibuprofen (ADVIL) 800 MG tablet Take 1 tablet by mouth every 8 (eight) hours as needed. 12/12/19  Yes [provider]  levothyroxine (SYNTHROID, LEVOTHROID) 25 MCG tablet Take by mouth.   Yes [provider]  loratadine (CLARITIN) 10 MG tablet Take 10 mg by mouth daily.   Yes [provider]  losartan (COZAAR) 25 MG tablet TAKE ONE TABLET BY MOUTH ONCE DAILY 12/25/16  Yes [provider]  methocarbamol (ROBAXIN) 750 MG tablet TAKE 1 TABLET BY MOUTH 4 TIMES DAILY FOR 10 DAYS 09/07/18  Yes [provider]  montelukast (SINGULAIR) 10 MG tablet Take 10 mg by mouth at bedtime.   Yes [provider]  Multiple Vitamins-Minerals (MULTIVITAMIN WITH MINERALS) tablet Take 1 tablet by mouth daily.   Yes [provider]  norethindrone-ethinyl estradiol (JUNEL FE,GILDESS FE,LOESTRIN FE) 1-20 MG-MCG tablet Take 1 tablet by mouth daily.   Yes [provider]  omeprazole (PRILOSEC) 40 MG capsule Take by mouth. 05/24/19  Yes [provider]  aspirin EC 81 MG tablet Take by mouth.    [provider]  benzonatate (TESSALON) 200 MG capsule Take one cap by mouth at bedtime as needed for cough.  May repeat in 4 to 6 hours 03/14/20   Bing Neighbors, FNP  buPROPion [facility] SR) 200 MG 12 hr tablet TAKE 1 TABLET BY MOUTH ONCE DAILY 08/30/18   Thresa Ross, MD  diphenhydrAMINE (SOMINEX) 25 MG tablet Take by mouth.    [provider]  doxycycline (VIBRAMYCIN) 100 MG capsule Take 1 capsule (100 mg total) by mouth 2 (two) times daily. 03/14/20   Bing Neighbors, FNP  DULoxetine (CYMBALTA) 30 MG capsule TAKE 1 CAPSULE BY MOUTH ONCE DAILY 08/30/18   Thresa Ross, MD  etanercept (ENBREL SURECLICK) 50 MG/ML injection INJECT ONE pen  SUBCUTANEOUSLY EVERY WEEK 06/16/17   [provider]  fluconazole (DIFLUCAN) 150 MG tablet Take one tab by mouth once for yeast infection.  May repeat in 72 hours. 10/05/18   Lattie Haw, MD  fluticasone (FLONASE) 50 MCG/ACT nasal spray Place into the nose. 05/06/18 05/06/19  [provider]  furosemide (LASIX) 20 MG tablet Take 20 mg by mouth.    [provider]  gabapentin (NEURONTIN) 100 MG capsule Take 100 mg by mouth 3 (three) times daily. 03/12/20   [provider]  lidocaine (XYLOCAINE) 2 % solution Use as directed 15 mLs in the mouth or throat  as needed for mouth pain (mix with warm salt water and gargle and spit). 03/14/20   Bing Neighbors, FNP  mometasone-formoterol (DULERA) 200-5 MCG/ACT AERO 2 puffs 2 (two) times daily.  07/10/16   [provider]  naproxen (NAPROSYN) 500 MG tablet Take 1 tablet (500 mg total) by mouth 2 (two) times daily with a meal. 05/02/20   Bing Neighbors, FNP  Norethindrone-Ethinyl Estradiol-Fe (GENERESSE) 0.8-25 MG-MCG tablet TAKE 1  BY MOUTH ONCE DAILY 11/07/19   [provider]  ondansetron (ZOFRAN ODT) 4 MG disintegrating tablet Take 1 tablet (4 mg total) by mouth every 8 (eight) hours as needed. 12/09/19   Lurene Shadow, PA-C  ARIPiprazole (ABILIFY) 10 MG tablet Take 1 tablet (10 mg total) by mouth daily. 07/13/17 09/10/17  Thresa Ross, MD    Family History Family History  Problem Relation Age of Onset  . Migraines Mother   . GER disease Mother   . Narcolepsy Mother   . Restless legs syndrome Mother   . COPD Father     Social History Social History   Tobacco Use  . Smoking status: Never Smoker  . Smokeless tobacco: Never Used  Vaping Use  . Vaping Use: Never used  Substance Use Topics  . Alcohol use: No  . Drug use: No     Allergies   Bactrim, Biaxin [clarithromycin], Clarithromycin, Dilaudid [hydromorphone hcl], Hydromorphone hcl, Imitrex [sumatriptan], Nalbuphine,  Penicillins, and Vancomycin  Review of Systems Review of Systems Pertinent negatives listed in HPI Physical Exam Triage Vital Signs ED Triage Vitals  Enc Vitals Group     BP 05/15/20 1716 (!) 141/96     Pulse Rate 05/15/20 1716 96     Resp 05/15/20 1716 17     Temp 05/15/20 1716 99 F (37.2 C)     Temp Source 05/15/20 1716 Oral     SpO2 05/15/20 1716 96 %     Weight --      Height 05/15/20 1721 5\' 5"  (1.651 m)     Head Circumference --      Peak Flow --      Pain Score 05/15/20 1721 4     Pain Loc --      Pain Edu? --      Excl. in GC? --    No data found.  Updated Vital Signs BP (!) 141/96 (BP Location: Left Arm)   Pulse 96   Temp 99 F (37.2 C) (Oral)   Resp 17   Ht 5\' 5"  (1.651 m)   LMP 05/05/2020 (Exact Date)   SpO2 96%   BMI 33.12 kg/m   Visual Acuity Right Eye Distance:   Left Eye Distance:   Bilateral Distance:    Right Eye Near:   Left Eye Near:    Bilateral Near:     Physical Exam Constitutional:      Appearance: She is obese.     Comments: Chronically ill appearing   HENT:     Head: Normocephalic.  Cardiovascular:     Rate and Rhythm: Normal rate.  Pulmonary:     Comments: Generalized coarse lung sounds, no rhonchi, no crackles, or no wheeze Skin:    General: Skin is warm and dry.  Neurological:     General: No focal deficit present.     Mental Status: She is alert and oriented to person, place, and time.     Motor: No weakness.     Gait: Gait normal.  Psychiatric:  Mood and Affect: Mood normal.    UC Treatments / Results  Labs (all labs ordered are listed, but only abnormal results are displayed) Labs Reviewed - No data to display  EKG   Radiology No results found.  Procedures Procedures (including critical care time)  Medications Ordered in UC Medications - No data to display  Initial Impression / Assessment and Plan / UC Course  I have reviewed the triage vital signs and the nursing notes.  Pertinent labs &  imaging results that were available during my care of the patient were reviewed by me and considered in my medical decision making (see chart for details).    Reviewed recent ER visit and PCP telemedicine encounter. Patient has had a complete ER work-up. Suspect fatigue and recurrent weakness is related to on-going COVID-19 long-haulers symptoms. Stool culture pending, as patient was unable to provide sample in clinic. Advised to return sample the following day. Reglan PRN as needed for nausea. Follow-up as directed with PCP and Post COVID clinic Final Clinical Impressions(s) / UC Diagnoses   Final diagnoses:  Diarrhea, unspecified type  Fatigue, unspecified type  Nausea without vomiting     Discharge Instructions     Return stool sample tomorrow for testing or you will need to follow-up with your primary care provider regarding this issue.  Discontinue the Zofran I have prescribed you Reglan 10 mg that she can take every 6 hours as needed for nausea. Purchase over-the-counter Imodium A-D this can help with your loose stools.  However please provide a stool sample prior to taking Imodium.  I also recommend you following up with Primus Bravo, NP at [facility] family medicine as your concerns today warrant further work-up by primary care provider given symptoms have been ongoing for a long period of time and previous ER work-up was negative for anything acute. Post Covid care clinic, recommend continued follow-up with the Post Covid clinic in [place] or you may get established at the Post Covid clinic in [place] information is included in your discharge summary.    ED Prescriptions    Medication Sig Dispense Auth. Provider   metoCLOPramide (REGLAN) 10 MG tablet Take 1 tablet (10 mg total) by mouth every 6 (six) hours. 30 tablet Bing Neighbors, FNP     PDMP not reviewed this encounter.   Bing Neighbors, FNP 05/17/20 [phone]

## 2020-05-15 NOTE — Discharge Instructions (Addendum)
Return stool sample tomorrow for testing or you will need to follow-up with your primary care provider regarding this issue.  Discontinue the Zofran I have prescribed you Reglan 10 mg that she can take every 6 hours as needed for nausea. Purchase over-the-counter Imodium A-D this can help with your loose stools.  However please provide a stool sample prior to taking Imodium.  I also recommend you following up with Primus Bravo, NP at [facility] family medicine as your concerns today warrant further work-up by primary care provider given symptoms have been ongoing for a long period of time and previous ER work-up was negative for anything acute. Post Covid care clinic, recommend continued follow-up with the Post Covid clinic in [place] or you may get established at the Post Covid clinic in [place] information is included in your discharge summary.

## 2020-05-24 ENCOUNTER — Telehealth: Payer: Self-pay | Admitting: Nurse Practitioner

## 2020-05-24 NOTE — Telephone Encounter (Signed)
Pt was called per referral from UC in [place] to make appt w/ [facility]. lvm

## 2020-08-08 ENCOUNTER — Emergency Department: Admit: 2020-08-08 | Discharge status: Absent without leave | Payer: Self-pay

## 2020-08-08 ENCOUNTER — Emergency Department (INDEPENDENT_AMBULATORY_CARE_PROVIDER_SITE_OTHER)
Admission: EM | Admit: 2020-08-08 | Discharge: 2020-08-08 | Disposition: A | Discharge status: Home / Self Care | Payer: BLUE CROSS/BLUE SHIELD | Source: Home / Self Care

## 2020-08-08 ENCOUNTER — Encounter: Payer: Self-pay | Admitting: Emergency Medicine

## 2020-08-08 ENCOUNTER — Other Ambulatory Visit: Payer: Self-pay

## 2020-08-08 DIAGNOSIS — B349 Viral infection, unspecified: Secondary | ICD-10-CM

## 2020-08-08 NOTE — ED Triage Notes (Signed)
Cough, headache, body aches, ear ache, sore throat, nausea, diarrhea, fever, chills, sweats x 1 week. Vaccinated

## 2020-08-08 NOTE — Discharge Instructions (Addendum)
Your COVID 19 results should result within 3-5 days. °Negative results are immediately resulted to Mychart.  ° °Positive results will receive a follow-up call from our clinic. If symptoms are present, I recommend home quarantine until results are known.  ° °Alternate Tylenol and ibuprofen as needed for body aches and fever.  Symptom management per recommendations discussed today.  If any breathing difficulty or chest pain develops go immediately to the closest emergency department for evaluation.  °

## 2020-08-08 NOTE — ED Provider Notes (Signed)
[facility]    CSN: [id number] Arrival date & time: 08/08/20  1917      History   Chief Complaint Chief Complaint  Patient presents with  . Cough    HPI Brenda Burton is a 50 y.o. female.   HPI  Patient presents with URI symptoms including cough and nasal congestion. Endorses a known of COVID exposure. COVID Vaccinated: Y Denies worrisome symptoms of shortness of breath, weakness, N&V, chest pain.  Past Medical History:  Diagnosis Date  . ADHD (attention deficit hyperactivity disorder)   . Asthma   . Depression   . Hypertension   . Migraine   . Psoriatic arthritis (HCC)   . Seasonal allergies   . Thyroid disease     Patient Active Problem List   Diagnosis Date Noted  . 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 History of 2019 novel coronavirus disease (COVID-19) 09/13/2019  . IFG (impaired fasting glucose) 04/05/2019  . Mild persistent asthma without complication 12/14/2018  . Acute bacterial bronchitis 10/28/2018  . Oral thrush 06/08/2018  . Allergic rhinitis 05/07/2018  . Exacerbation of asthma 05/07/2018  . Gastro-esophageal reflux disease without esophagitis 05/07/2018  . Long-term use of high-risk medication 12/09/2016  . Essential hypertension 06/23/2016  . Palpitations 06/10/2016  . Right foot pain 02/22/2016  . Left knee pain 02/22/2016  . Hyperlipidemia 03/28/2015  . ADHD (attention deficit hyperactivity disorder) 03/28/2011  . ADHD 10/21/2006  . TMJ PAIN 10/21/2006  . COUGH 08/05/2006  . ALLERGIC RHINITIS, SEASONAL 05/28/2006  . Major depressive disorder, recurrent episode (HCC) 05/05/2006  . MIGRAINE, UNSPEC., W/O INTRACTABLE MIGRAINE 05/05/2006    Past Surgical History:  Procedure Laterality Date  . APPENDECTOMY    . CARDIAC SURGERY     cardiac ablations  . DILATION AND CURETTAGE OF UTERUS    . GANGLION CYST EXCISION     right wrist  . HERNIA REPAIR    . OVARIAN CYST REMOVAL    . SHOULDER SURGERY      right   . TONSILLECTOMY      OB History    Gravida  1   Para      Term      Preterm      AB      Living        SAB      IAB      Ectopic      Multiple      Live Births               Home Medications    Prior to Admission medications   Medication Sig Start Date End Date Taking? Authorizing Provider  albuterol (PROVENTIL HFA;VENTOLIN HFA) 108 (90 BASE) MCG/ACT inhaler Inhale 2 puffs into the lungs every 4 (four) hours as needed for wheezing or shortness of breath. 10/21/14   Lattie Haw, MD  aspirin 325 MG EC tablet Take by mouth. 08/05/19   [provider]  buPROPion (WELLBUTRIN SR) 200 MG 12 hr tablet TAKE 1 TABLET BY MOUTH ONCE DAILY 08/30/18   Thresa Ross, MD  clonazePAM (KLONOPIN) 0.5 MG tablet Take 0.5 mg by mouth daily as needed. 02/21/20   [provider]  diphenhydrAMINE (SOMINEX) 25 MG tablet Take by mouth.    [provider]  Doxepin HCl 6 MG TABS Take by mouth. 05/08/20   [provider]  DULoxetine (CYMBALTA) 30 MG capsule TAKE 1 CAPSULE BY MOUTH ONCE DAILY 08/30/18   Thresa Ross,  MD  DULoxetine (CYMBALTA) 60 MG capsule TAKE 1 CAPSULE BY MOUTH ONCE DAILY Patient taking differently: 120 mg. ** Do NOT chew, crush, or open capsule ** 08/30/18   Thresa Ross, MD  ibuprofen (ADVIL) 800 MG tablet Take 1 tablet by mouth every 8 (eight) hours as needed. 12/12/19   [provider]  levothyroxine (SYNTHROID, LEVOTHROID) 25 MCG tablet Take by mouth.    [provider]  loratadine (CLARITIN) 10 MG tablet Take 10 mg by mouth daily.    [provider]  losartan (COZAAR) 25 MG tablet TAKE ONE TABLET BY MOUTH ONCE DAILY 12/25/16   [provider]  methocarbamol (ROBAXIN) 750 MG tablet TAKE 1 TABLET BY MOUTH 4 TIMES DAILY FOR 10 DAYS 09/07/18   [provider]  montelukast (SINGULAIR) 10 MG tablet Take 10 mg by mouth at bedtime.    [provider]  Multiple Vitamins-Minerals  (MULTIVITAMIN WITH MINERALS) tablet Take 1 tablet by mouth daily.    [provider]  naproxen (NAPROSYN) 500 MG tablet Take 1 tablet (500 mg total) by mouth 2 (two) times daily with a meal. 05/02/20   Bing Neighbors, FNP  omeprazole (PRILOSEC) 40 MG capsule Take by mouth. 05/24/19   [provider]  ARIPiprazole (ABILIFY) 10 MG tablet Take 1 tablet (10 mg total) by mouth daily. 07/13/17 09/10/17  Thresa Ross, MD    Family History Family History  Problem Relation Age of Onset  . Migraines Mother   . GER disease Mother   . Narcolepsy Mother   . Restless legs syndrome Mother   . COPD Father     Social History Social History   Tobacco Use  . Smoking status: Never Smoker  . Smokeless tobacco: Never Used  Vaping Use  . Vaping Use: Never used  Substance Use Topics  . Alcohol use: No  . Drug use: No     Allergies   Bactrim, Biaxin [clarithromycin], Clarithromycin, Dilaudid [hydromorphone hcl], Hydromorphone hcl, Imitrex [sumatriptan], Nalbuphine, Penicillins, and Vancomycin   Review of Systems Review of Systems Pertinent negatives listed in HPI  Physical Exam Triage Vital Signs ED Triage Vitals  Enc Vitals Group     BP 08/08/20 1955 (!) 134/93     Pulse Rate 08/08/20 1955 90     Resp 08/08/20 1955 20     Temp 08/08/20 1955 98.9 F (37.2 C)     Temp Source 08/08/20 1955 Oral     SpO2 08/08/20 1955 92 %     Weight 08/08/20 1957 190 lb (86.2 kg)     Height 08/08/20 1957 5\' 5"  (1.651 m)     Head Circumference --      Peak Flow --      Pain Score 08/08/20 1957 6     Pain Loc --      Pain Edu? --      Excl. in GC? --    No data found.  Updated Vital Signs BP (!) 134/93 (BP Location: Left Arm)   Pulse 90   Temp 98.9 F (37.2 C) (Oral)   Resp 20   Ht 5\' 5"  (1.651 m)   Wt 190 lb (86.2 kg)   LMP 07/08/2020 (Exact Date)   SpO2 96%   BMI 31.62 kg/m   Visual Acuity Right Eye Distance:   Left Eye Distance:   Bilateral Distance:    Right  Eye Near:   Left Eye Near:    Bilateral Near:     Physical Exam  General  Appearance:    Alert, cooperative, no distress  HENT:   Normocephalic, ears normal, nares mucosal edema with congestion, rhinorrhea, oropharynx normal   Eyes:    PERRL, conjunctiva/corneas clear, EOM's intact       Lungs:     Clear to auscultation bilaterally, respirations unlabored  Heart:    Regular rate and rhythm  Neurologic:   Awake, alert, oriented x 3. No apparent focal neurological           defect.      UC Treatments / Results  Labs (all labs ordered are listed, but only abnormal results are displayed) Labs Reviewed - No data to display  EKG   Radiology No results found.  Procedures Procedures (including critical care time)  Medications Ordered in UC Medications - No data to display  Initial Impression / Assessment and Plan / UC Course  I have reviewed the triage vital signs and the nursing notes.  Pertinent labs & imaging results that were available during my care of the patient were reviewed by me and considered in my medical decision making (see chart for details).    COVID  test pending. Symptom management warranted only.  Manage fever with Tylenol and ibuprofen.  Nasal symptoms with over-the-counter antihistamines recommended.  Treatment per discharge medications/discharge instructions.  Red flags/ER precautions given. The most current CDC isolation/quarantine recommendation advised.  Final Clinical Impressions(s) / UC Diagnoses   Final diagnoses:  Viral illness     Discharge Instructions     Your COVID 19 results should result within 3-5 days. Negative results are immediately resulted to Mychart. Positive results will receive a follow-up call from our clinic. If symptoms are present, I recommend home quarantine until results are known.  Alternate Tylenol and ibuprofen as needed for body aches and fever.  Symptom management per recommendations discussed today.  If any breathing  difficulty or chest pain develops go immediately to the closest emergency department for evaluation.     ED Prescriptions    None     PDMP not reviewed this encounter.   Bing Neighbors, FNP 08/12/20 [phone]

## 2020-08-10 LAB — SARS-COV-2 RNA,(COVID-19) QUALITATIVE NAAT: SARS CoV2 RNA: NOT DETECTED

## 2020-10-05 IMAGING — CR CHEST - 2 VIEW
2 series · 2 of 2 positions shown · non-contrast
Comparison: 10/05/2018

CLINICAL DATA: Fever and cough

EXAM:
CHEST - 2 VIEW

[w chest pa]
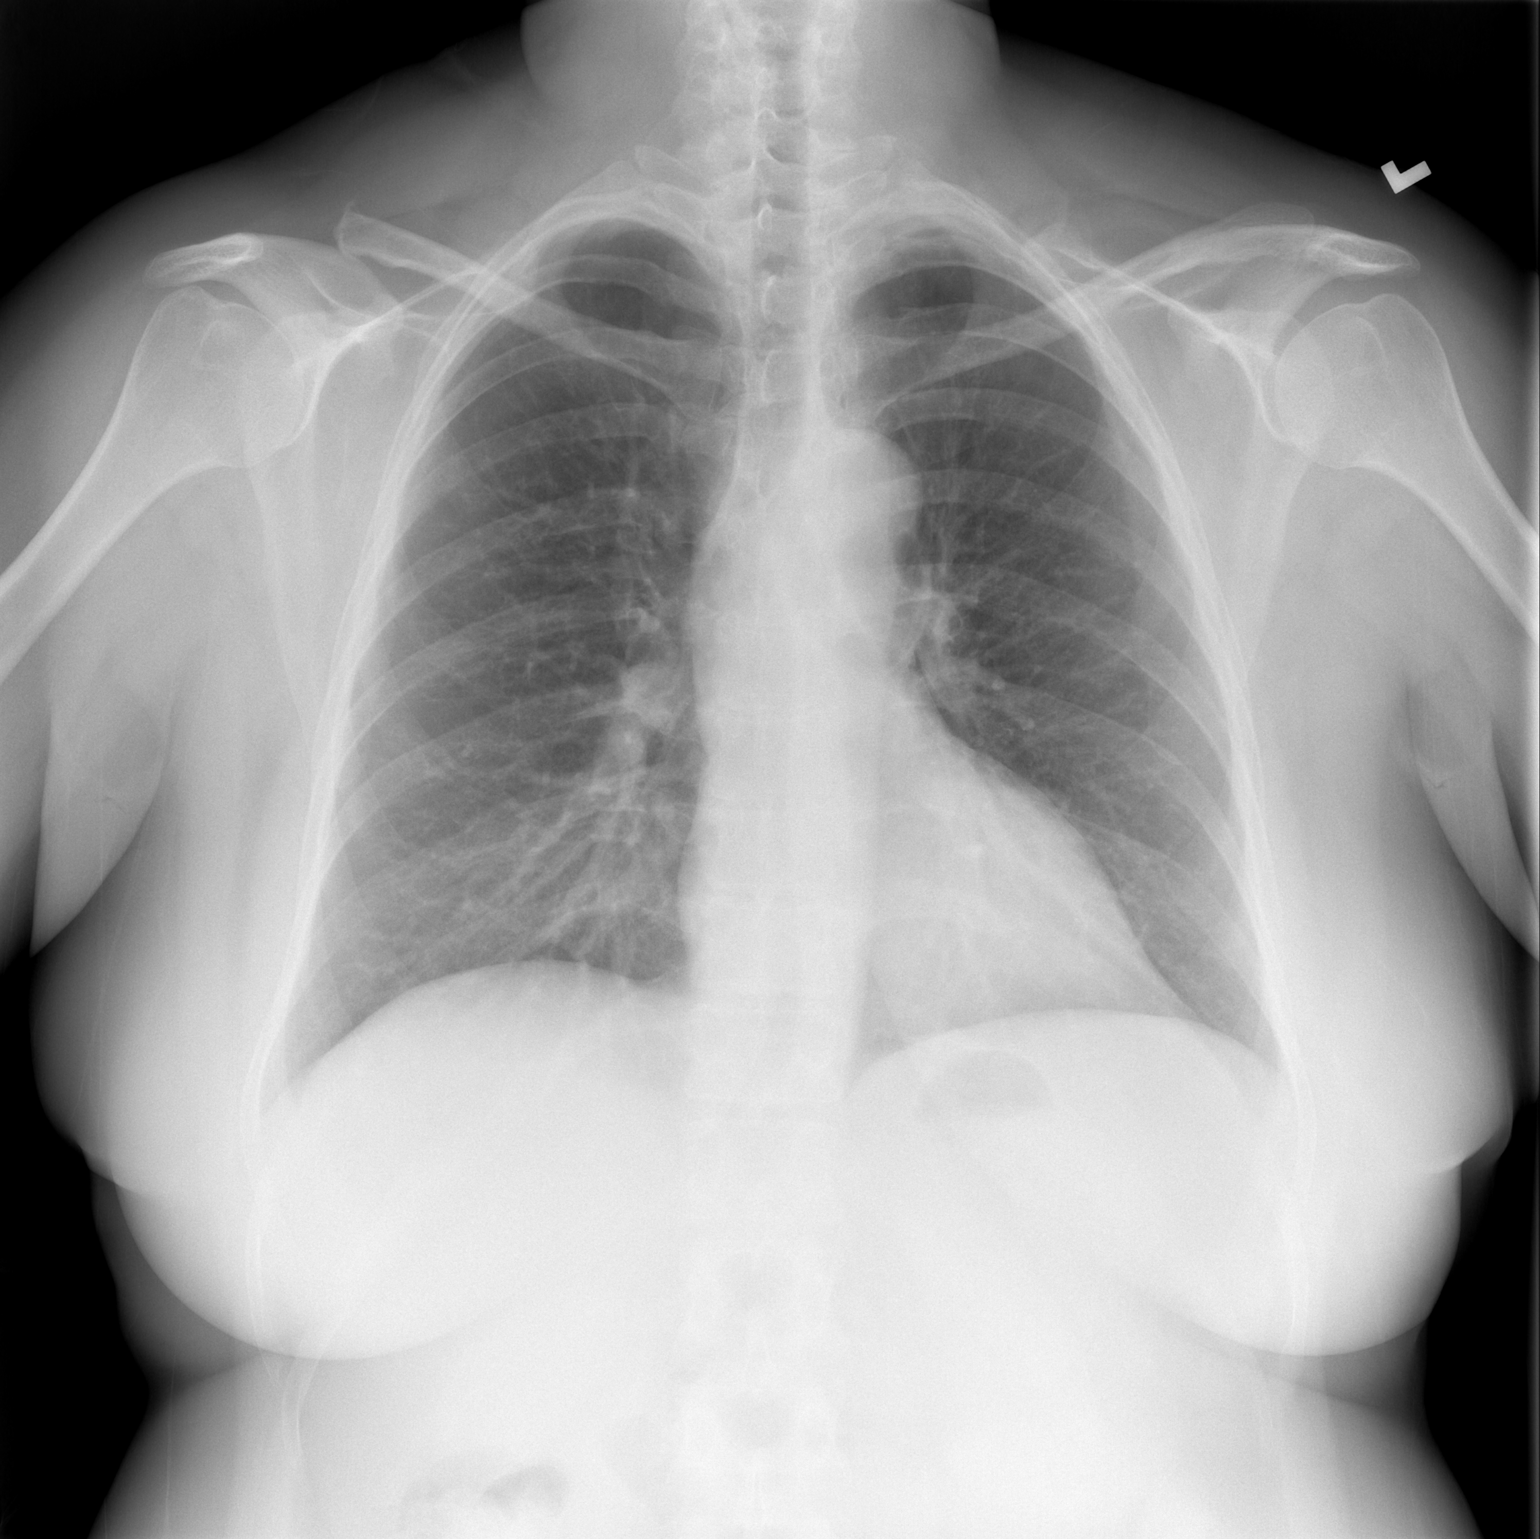

[w chest lat]
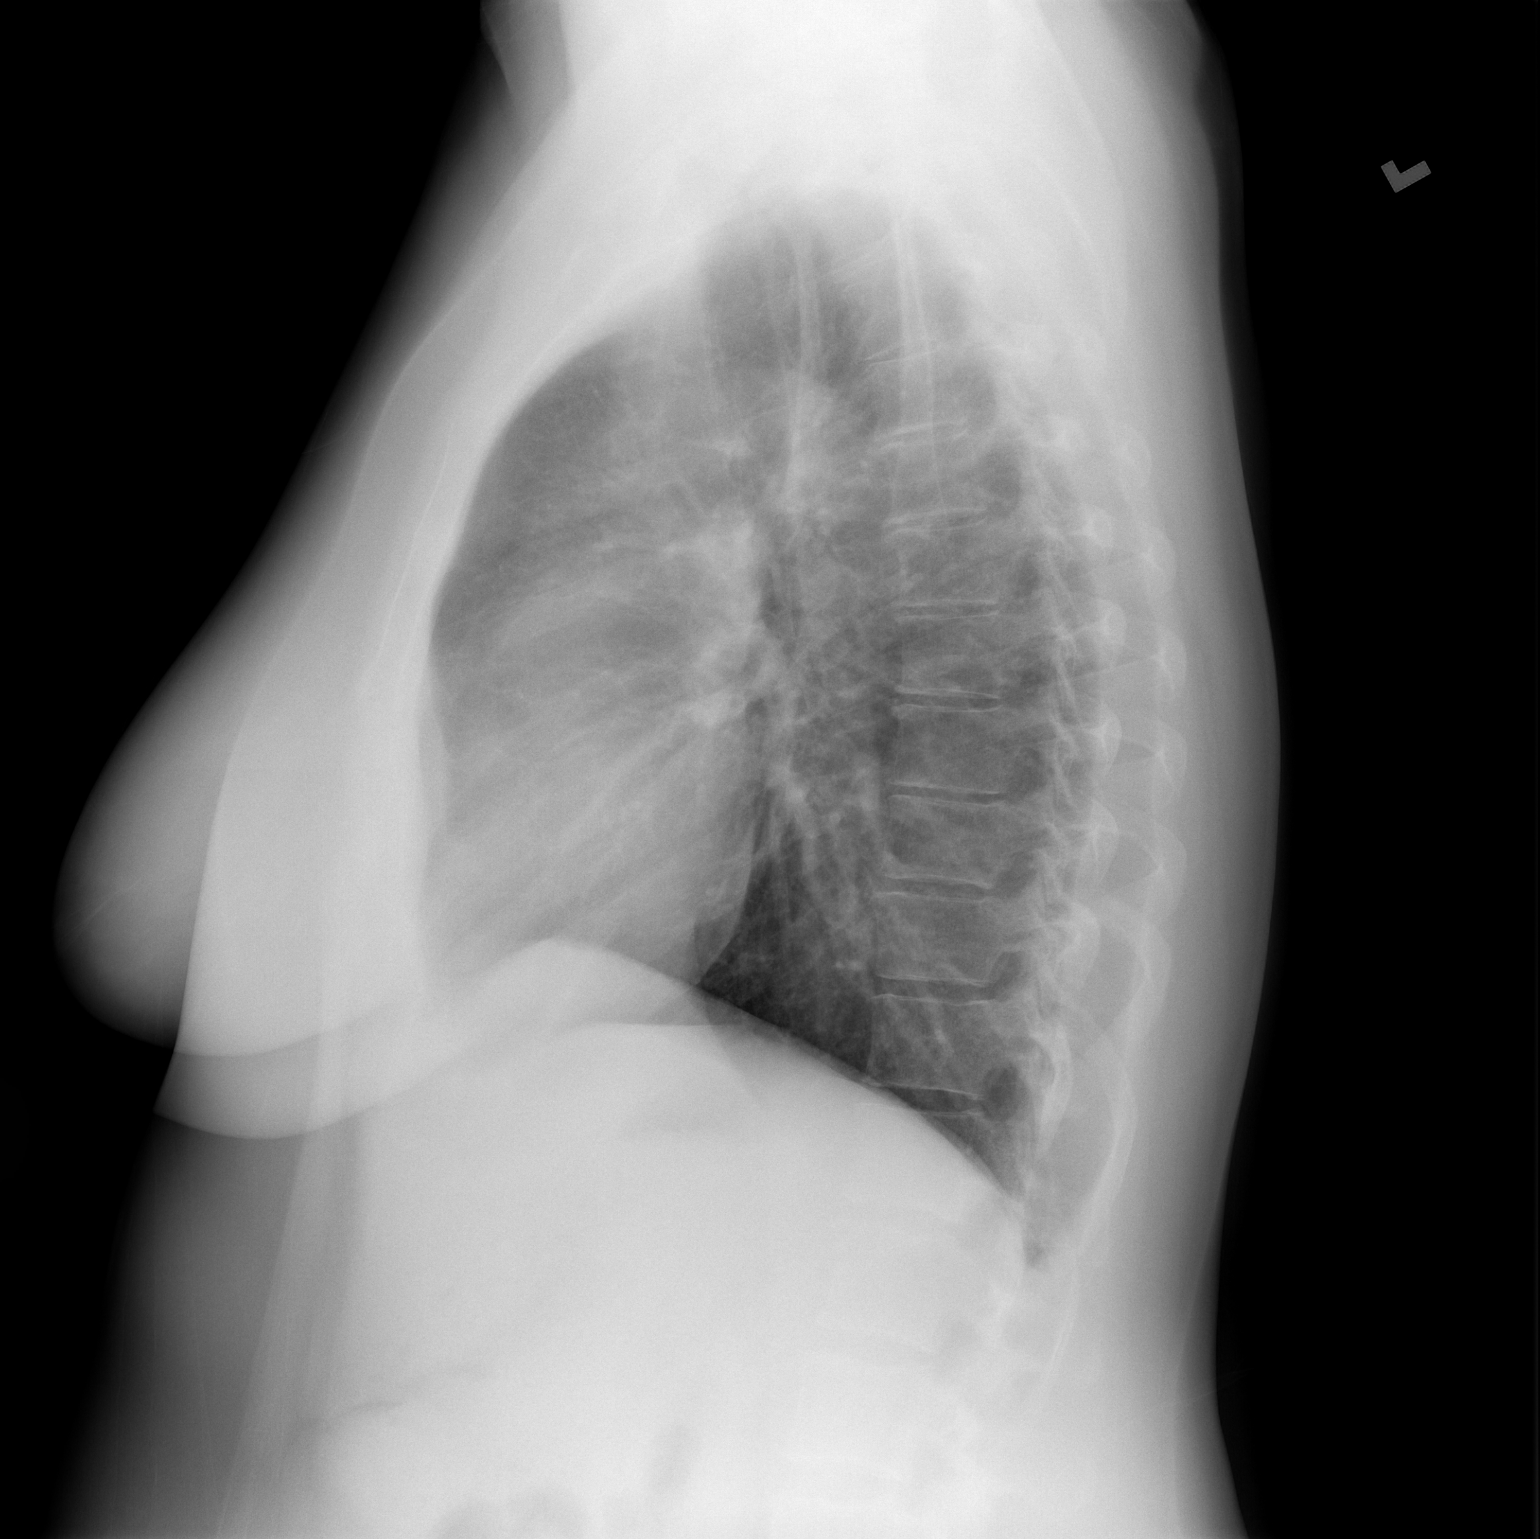

[2 of 2 positions shown; findings below may reference images not displayed]

FINDINGS: The heart size and mediastinal contours are within normal limits.
Both lungs are clear. The visualized skeletal structures are
unremarkable.
IMPRESSION: No active cardiopulmonary disease.

## 2020-11-19 ENCOUNTER — Other Ambulatory Visit: Payer: Self-pay

## 2020-11-19 ENCOUNTER — Emergency Department (INDEPENDENT_AMBULATORY_CARE_PROVIDER_SITE_OTHER)
Admission: EM | Admit: 2020-11-19 | Discharge: 2020-11-19 | Disposition: A | Discharge status: Home / Self Care | Payer: BLUE CROSS/BLUE SHIELD | Source: Home / Self Care

## 2020-11-19 ENCOUNTER — Encounter: Payer: Self-pay | Admitting: Emergency Medicine

## 2020-11-19 ENCOUNTER — Emergency Department (INDEPENDENT_AMBULATORY_CARE_PROVIDER_SITE_OTHER): Payer: BLUE CROSS/BLUE SHIELD

## 2020-11-19 DIAGNOSIS — W19XXXA Unspecified fall, initial encounter: Secondary | ICD-10-CM

## 2020-11-19 DIAGNOSIS — M25561 Pain in right knee: Secondary | ICD-10-CM

## 2020-11-19 DIAGNOSIS — M25551 Pain in right hip: Secondary | ICD-10-CM | POA: Diagnosis not present

## 2020-11-19 NOTE — ED Triage Notes (Signed)
Patient states that she fell on 11-14-2020 njured right knee, right hip, bruising down leg.  Patient has taken some Ibuprofen and Methocarbamol.

## 2020-11-19 NOTE — Discharge Instructions (Addendum)
R hip xray today shows no fracture or misalignment  We have placed a brace to the right knee.  Continue to use ice as needed to the area  Continue to use ibuprofen and Tylenol as needed for pain.  Continue Robaxin as needed for muscle spasms.  Follow-up with sports medicine if symptoms are persisting

## 2020-11-19 NOTE — ED Provider Notes (Signed)
[facility]    CSN: [id number] Arrival date & time: 11/19/20  1925      History   Chief Complaint Chief Complaint  Patient presents with  . Fall    HPI Brenda Burton is a 50 y.o. female.   Reports that she fell on 11/14/2020.  Reports that she was trying to pick up her dog and get the dog in the house.  Reports that she hit her right knee, right hip, right shoulder, and caught herself with both hands.  Reports that she followed up with the hand specialist today.  Reports that she is following up with orthopedics for her right shoulder in the coming week.  Has been taking Robaxin as prescribed from the ER when she was seen on 11/14/2020 with some relief.  Has been taking 800 mg of ibuprofen as needed for pain with some relief as well.  Has been using ice and compression to the area with little relief.  X-rays from previous visit in the ER were all negative.  They did not x-ray her right hip.  Denies previous symptoms.  Denies new pain, radiating pain, dizziness, loss of consciousness, nausea, vomiting, diarrhea, rash, fever, other symptoms.  ROS per HPI  The history is provided by the patient.  Fall    Past Medical History:  Diagnosis Date  . ADHD (attention deficit hyperactivity disorder)   . Asthma   . Depression   . Hypertension   . Migraine   . Psoriatic arthritis (HCC)   . Seasonal allergies   . Thyroid disease     Patient Active Problem List   Diagnosis Date Noted  . 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 History of 2019 novel coronavirus disease (COVID-19) 09/13/2019  . IFG (impaired fasting glucose) 04/05/2019  . Mild persistent asthma without complication 12/14/2018  . Acute bacterial bronchitis 10/28/2018  . Oral thrush 06/08/2018  . Allergic rhinitis 05/07/2018  . Exacerbation of asthma 05/07/2018  . Gastro-esophageal reflux disease without esophagitis 05/07/2018  . Long-term use of high-risk medication 12/09/2016  . Essential  hypertension 06/23/2016  . Palpitations 06/10/2016  . Right foot pain 02/22/2016  . Left knee pain 02/22/2016  . Hyperlipidemia 03/28/2015  . ADHD (attention deficit hyperactivity disorder) 03/28/2011  . ADHD 10/21/2006  . TMJ PAIN 10/21/2006  . COUGH 08/05/2006  . ALLERGIC RHINITIS, SEASONAL 05/28/2006  . Major depressive disorder, recurrent episode (HCC) 05/05/2006  . MIGRAINE, UNSPEC., W/O INTRACTABLE MIGRAINE 05/05/2006    Past Surgical History:  Procedure Laterality Date  . APPENDECTOMY    . CARDIAC SURGERY     cardiac ablations  . DILATION AND CURETTAGE OF UTERUS    . GANGLION CYST EXCISION     right wrist  . HERNIA REPAIR    . OVARIAN CYST REMOVAL    . SHOULDER SURGERY     right   . TONSILLECTOMY      OB History    Gravida  1   Para      Term      Preterm      AB      Living        SAB      IAB      Ectopic      Multiple      Live Births               Home Medications    Prior to Admission medications   Medication Sig Start Date End Date Taking?  Authorizing Provider  albuterol (PROVENTIL HFA;VENTOLIN HFA) 108 (90 BASE) MCG/ACT inhaler Inhale 2 puffs into the lungs every 4 (four) hours as needed for wheezing or shortness of breath. 10/21/14  Yes Lattie Haw, MD  aspirin 325 MG EC tablet Take by mouth. 08/05/19  Yes [provider]  buPROPion (WELLBUTRIN SR) 200 MG 12 hr tablet TAKE 1 TABLET BY MOUTH ONCE DAILY 08/30/18  Yes Thresa Ross, MD  clonazePAM (KLONOPIN) 0.5 MG tablet Take 0.5 mg by mouth daily as needed. 02/21/20  Yes [provider]  diphenhydrAMINE (SOMINEX) 25 MG tablet Take by mouth.   Yes [provider]  Doxepin HCl 6 MG TABS Take by mouth. 05/08/20  Yes [provider]  DULoxetine (CYMBALTA) 30 MG capsule TAKE 1 CAPSULE BY MOUTH ONCE DAILY 08/30/18  Yes Thresa Ross, MD  DULoxetine (CYMBALTA) 60 MG capsule TAKE 1 CAPSULE BY MOUTH ONCE DAILY Patient taking differently: 120 mg. ** Do NOT  chew, crush, or open capsule ** 08/30/18  Yes Thresa Ross, MD  ibuprofen (ADVIL) 800 MG tablet Take 1 tablet by mouth every 8 (eight) hours as needed. 12/12/19  Yes [provider]  levothyroxine (SYNTHROID, LEVOTHROID) 25 MCG tablet Take by mouth.   Yes [provider]  loratadine (CLARITIN) 10 MG tablet Take 10 mg by mouth daily.   Yes [provider]  losartan (COZAAR) 25 MG tablet TAKE ONE TABLET BY MOUTH ONCE DAILY 12/25/16  Yes [provider]  methocarbamol (ROBAXIN) 750 MG tablet TAKE 1 TABLET BY MOUTH 4 TIMES DAILY FOR 10 DAYS 09/07/18  Yes [provider]  montelukast (SINGULAIR) 10 MG tablet Take 10 mg by mouth at bedtime.   Yes [provider]  Multiple Vitamins-Minerals (MULTIVITAMIN WITH MINERALS) tablet Take 1 tablet by mouth daily.   Yes [provider]  naproxen (NAPROSYN) 500 MG tablet Take 1 tablet (500 mg total) by mouth 2 (two) times daily with a meal. 05/02/20  Yes Bing Neighbors, FNP  omeprazole (PRILOSEC) 40 MG capsule Take by mouth. 05/24/19  Yes [provider]  ARIPiprazole (ABILIFY) 10 MG tablet Take 1 tablet (10 mg total) by mouth daily. 07/13/17 09/10/17  Thresa Ross, MD    Family History Family History  Problem Relation Age of Onset  . Migraines Mother   . GER disease Mother   . Narcolepsy Mother   . Restless legs syndrome Mother   . COPD Father     Social History Social History   Tobacco Use  . Smoking status: Never Smoker  . Smokeless tobacco: Never Used  Vaping Use  . Vaping Use: Never used  Substance Use Topics  . Alcohol use: No  . Drug use: No     Allergies   Bactrim, Biaxin [clarithromycin], Clarithromycin, Dilaudid [hydromorphone hcl], Hydromorphone hcl, Imitrex [sumatriptan], Nalbuphine, Penicillins, and Vancomycin   Review of Systems Review of Systems   Physical Exam Triage Vital Signs ED Triage Vitals  Enc Vitals Group     BP      Pulse      Resp       Temp      Temp src      SpO2      Weight      Height      Head Circumference      Peak Flow      Pain Score      Pain Loc      Pain Edu?      Excl. in  GC?    No data found.  Updated Vital Signs BP 118/79 (BP Location: Left Arm)   Pulse 97   SpO2 99%      Physical Exam Vitals and nursing note reviewed.  Constitutional:      General: She is not in acute distress.    Appearance: Normal appearance. She is well-developed. She is not ill-appearing.  HENT:     Head: Normocephalic and atraumatic.  Eyes:     Conjunctiva/sclera: Conjunctivae normal.  Cardiovascular:     Rate and Rhythm: Normal rate and regular rhythm.     Heart sounds: Normal heart sounds. No murmur heard.   Pulmonary:     Effort: Pulmonary effort is normal. No respiratory distress.     Breath sounds: Normal breath sounds.  Abdominal:     Palpations: Abdomen is soft.     Tenderness: There is no abdominal tenderness.  Musculoskeletal:        General: Swelling and tenderness present.     Cervical back: Normal range of motion and neck supple.       Legs:     Comments: Area of bruising to the anterior right leg, swelling and tenderness to patellar tendon at the right knee.  Skin:    General: Skin is warm and dry.     Capillary Refill: Capillary refill takes less than 2 seconds.  Neurological:     General: No focal deficit present.     Mental Status: She is alert and oriented to person, place, and time.  Psychiatric:        Mood and Affect: Mood normal.        Behavior: Behavior normal.        Thought Content: Thought content normal.      UC Treatments / Results  Labs (all labs ordered are listed, but only abnormal results are displayed) Labs Reviewed - No data to display  EKG   Radiology DG HIP UNILAT WITH PELVIS 2-3 VIEWS RIGHT  Result Date: 11/19/2020 CLINICAL DATA:  Fall, RIGHT hip pain EXAM: DG HIP (WITH OR WITHOUT PELVIS) 2-3V RIGHT COMPARISON:  None. FINDINGS: Hips are located. No  evidence of pelvic fracture or sacral fracture. Dedicated view of the Right hip demonstrates no femoral neck fracture. IMPRESSION: No pelvic fracture or RIGHT hip fracture. No acute osseous abnormality. Electronically Signed   By: Genevive Bi M.D.   On: 11/19/2020 20:24    Procedures Procedures (including critical care time)  Medications Ordered in UC Medications - No data to display  Initial Impression / Assessment and Plan / UC Course  I have reviewed the triage vital signs and the nursing notes.  Pertinent labs & imaging results that were available during my care of the patient were reviewed by me and considered in my medical decision making (see chart for details).    Fall Right knee pain Right hip pain  X-ray of right hip today is negative for fracture and misalignment Will apply hinged knee brace to the right knee for some added support Continue with ibuprofen and Tylenol as needed for pain Continue with ice to the knee as needed for swelling Continue with compression as well Continue with Robaxin as needed for muscle spasms Follow-up with sports medicine if symptoms are persisting Follow-up in the ER for change or loss of strength or sensation, acute worsening symptoms, trouble swallowing, trouble breathing, loss of bowel or bladder function, other concerning symptoms  Final Clinical Impressions(s) / UC Diagnoses   Final diagnoses:  Fall,  initial encounter  Acute pain of right knee  Right hip pain     Discharge Instructions     R hip xray today shows no fracture or misalignment  We have placed a brace to the right knee.  Continue to use ice as needed to the area  Continue to use ibuprofen and Tylenol as needed for pain.  Continue Robaxin as needed for muscle spasms.  Follow-up with sports medicine if symptoms are persisting    ED Prescriptions    None     PDMP not reviewed this encounter.   Moshe CiproMatthews, Oluwatomisin Hustead, NP 11/19/20 2028

## 2022-04-26 IMAGING — DX DG WRIST COMPLETE 3+V*L*
4 series · 4 of 4 positions shown · non-contrast
Comparison: Bilateral hand x-rays dated April 18, 2020.

CLINICAL DATA: Fall.  Radial wrist pain.

EXAM:
LEFT WRIST - COMPLETE 3+ VIEW

[wrist pa]
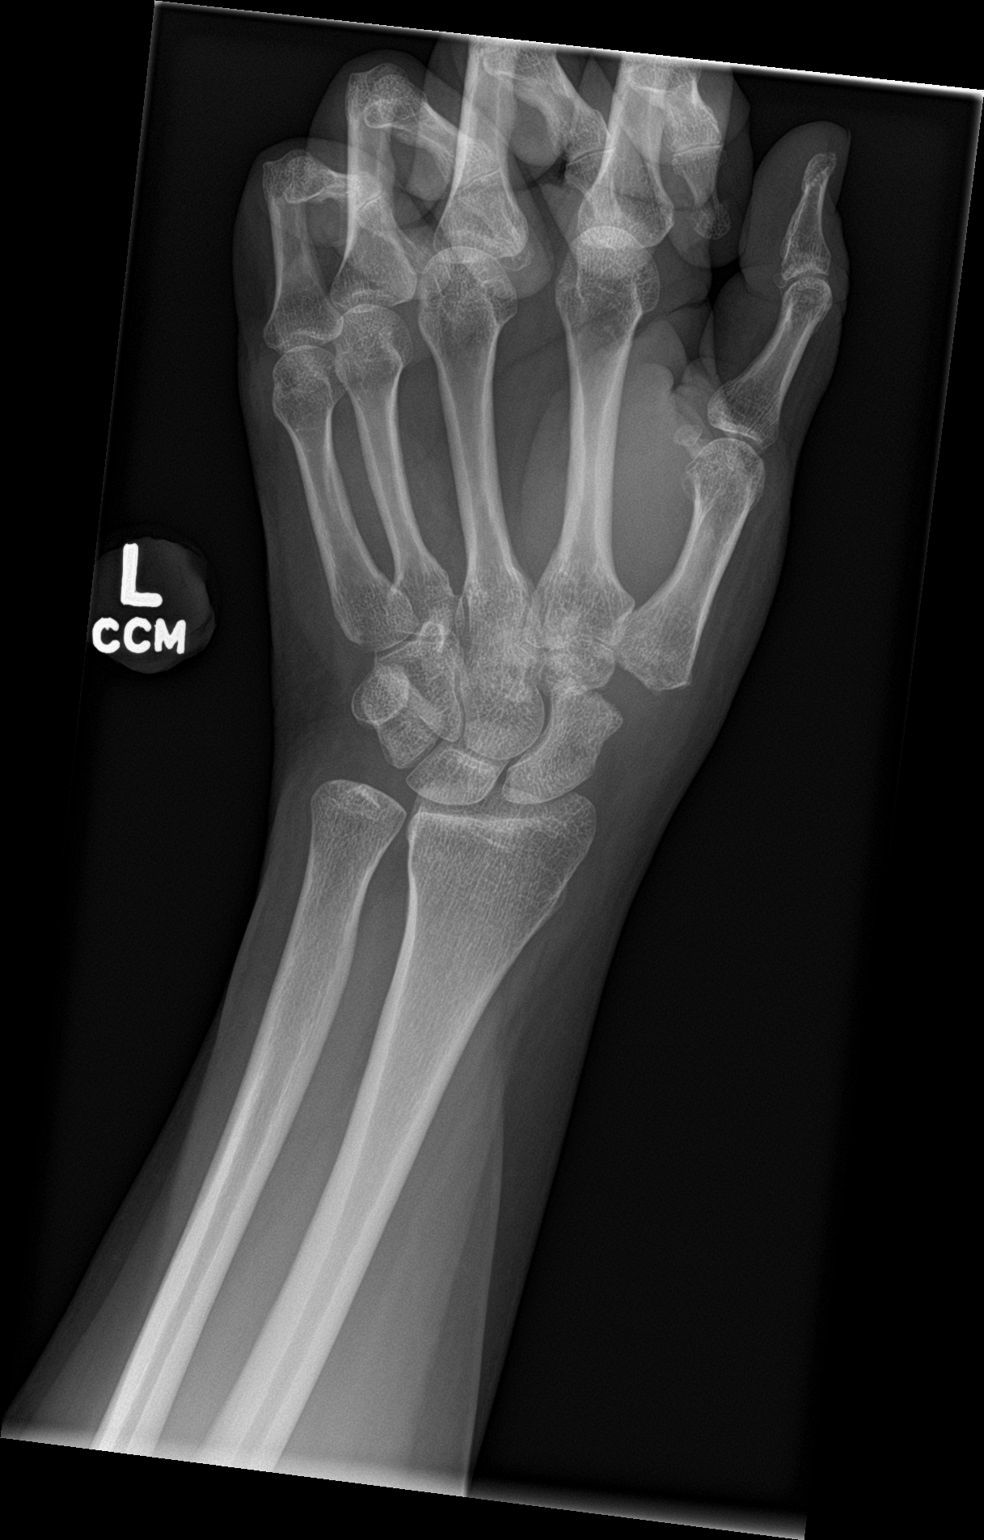

[wrist obl]
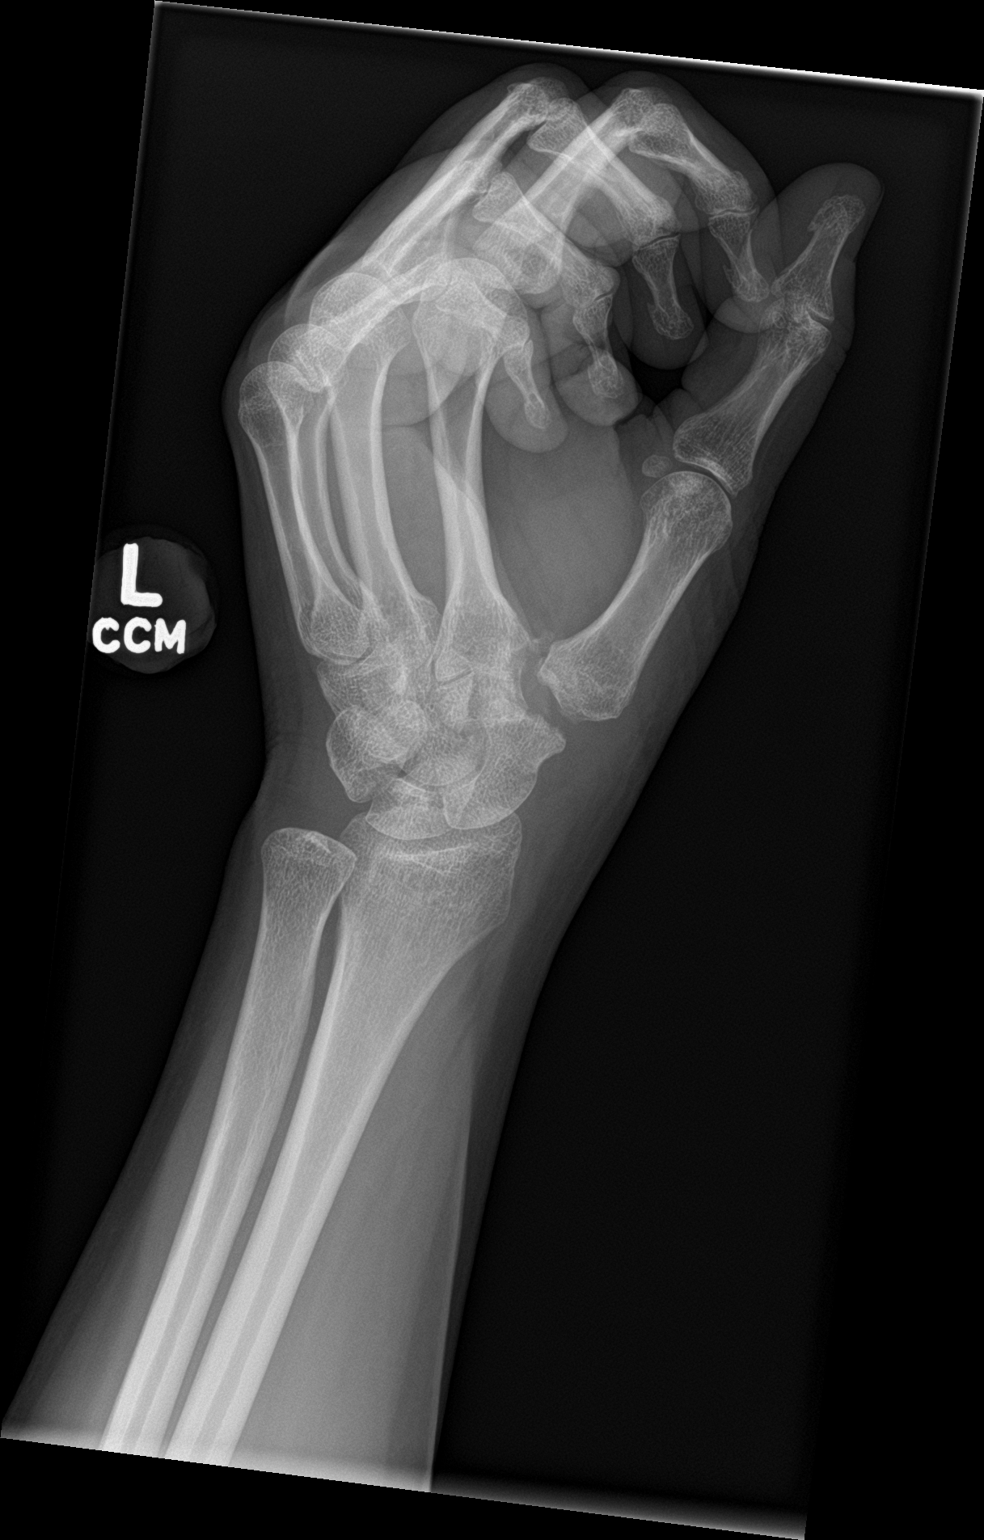

[wrist lat]
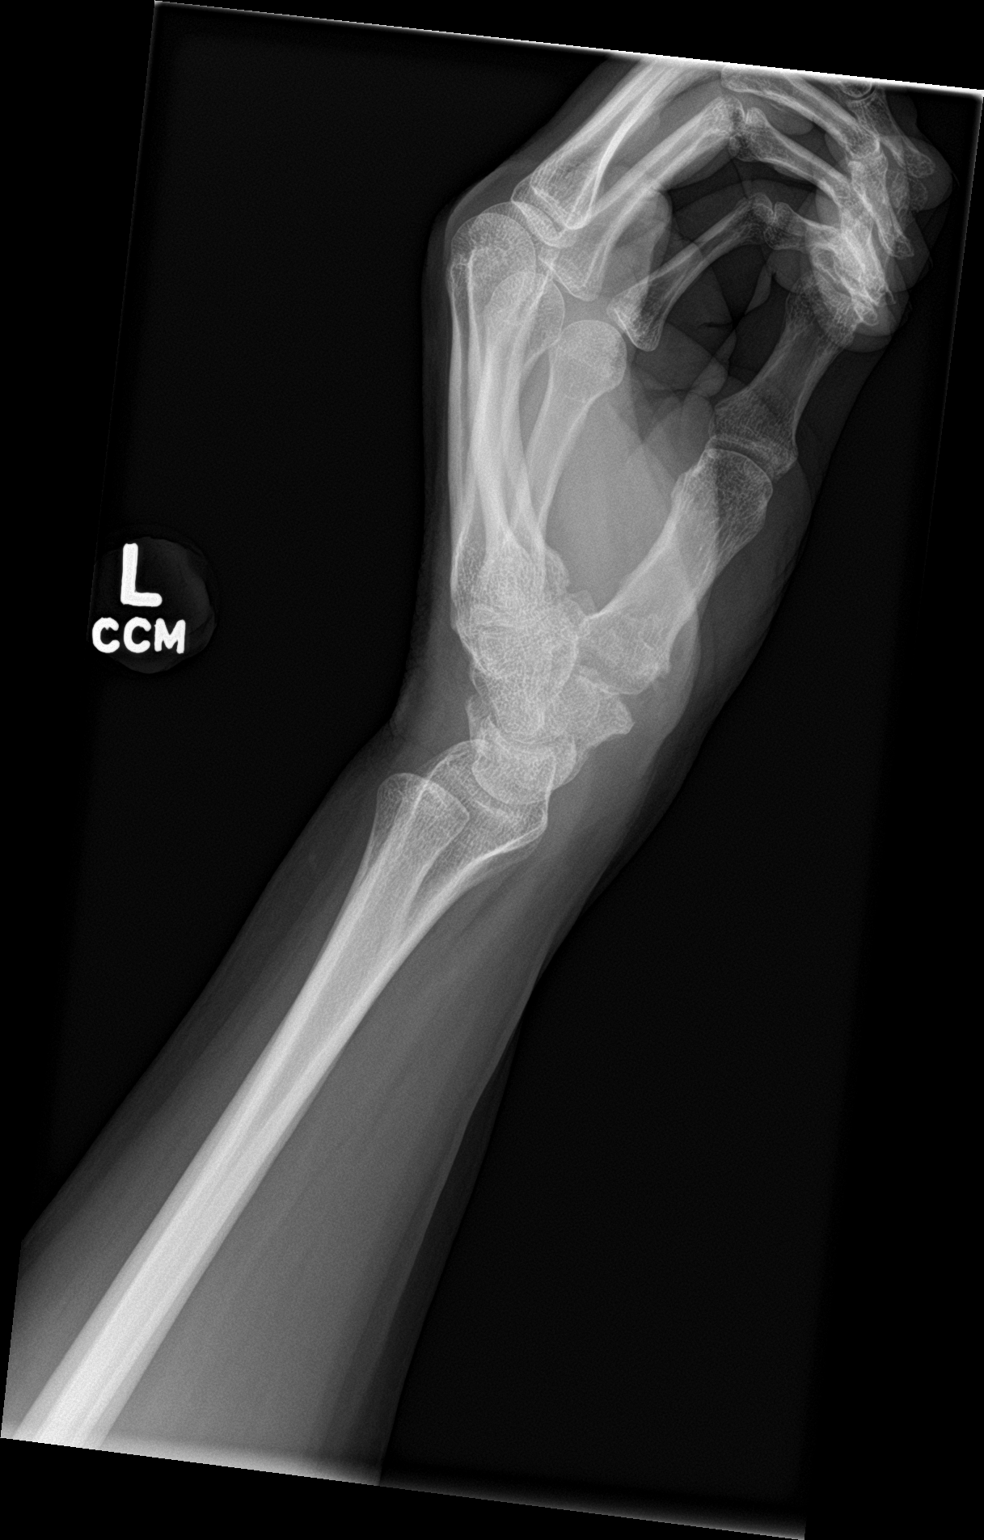

[wrist navicular]
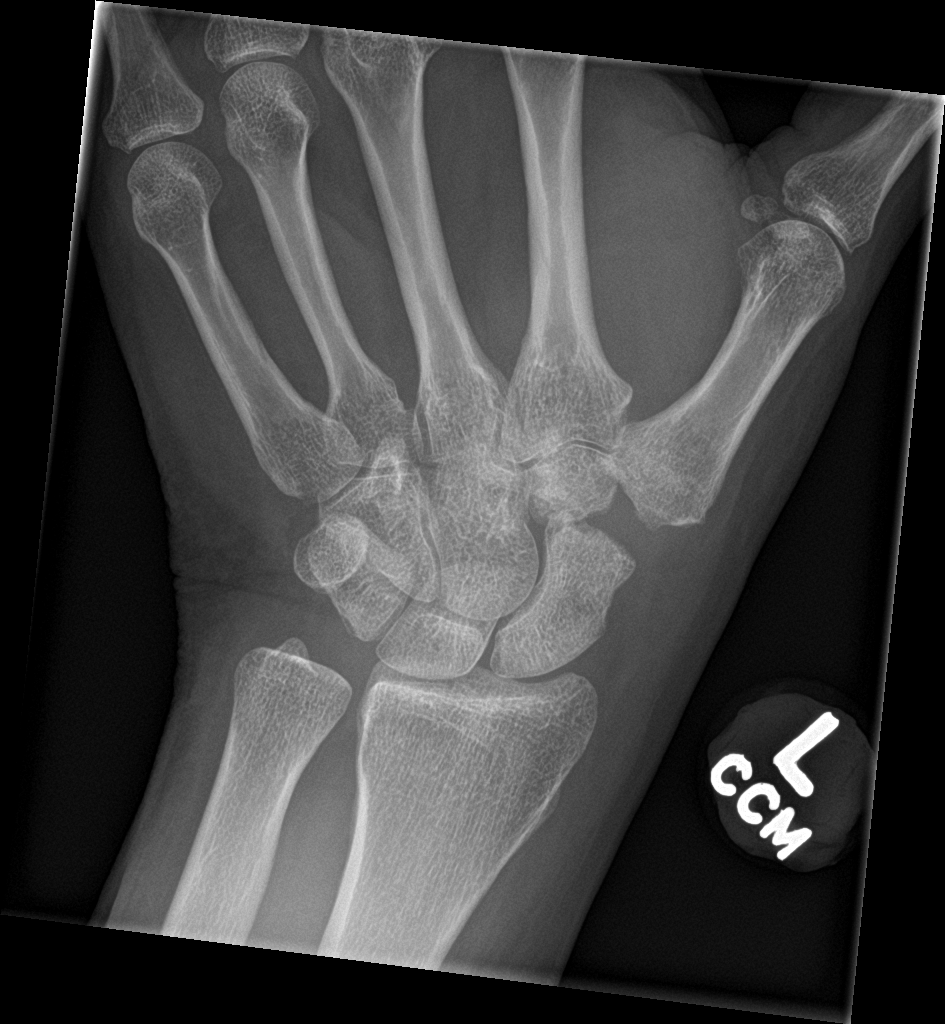

[4 of 4 positions shown; findings below may reference images not displayed]

FINDINGS: No acute fracture or dislocation. Prior trapeziectomy. Joint spaces
are preserved. Soft tissues are unremarkable.
IMPRESSION: 1. No acute osseous abnormality.
